# Patient Record
Sex: Female | Born: 1990 | Race: White | Hispanic: No | Smoking: Never smoker
Health system: Southern US, Community
[De-identification: ages and names within clinical notes are randomized; demographics above are authoritative.]

## PROBLEM LIST (undated history)

## (undated) ENCOUNTER — Emergency Department (HOSPITAL_COMMUNITY): Admission: EM | Payer: Self-pay | Source: Home / Self Care

## (undated) DIAGNOSIS — I10 Essential (primary) hypertension: Secondary | ICD-10-CM

## (undated) DIAGNOSIS — J45909 Unspecified asthma, uncomplicated: Secondary | ICD-10-CM

## (undated) DIAGNOSIS — Z973 Presence of spectacles and contact lenses: Secondary | ICD-10-CM

## (undated) DIAGNOSIS — R7303 Prediabetes: Secondary | ICD-10-CM

## (undated) DIAGNOSIS — Z8719 Personal history of other diseases of the digestive system: Secondary | ICD-10-CM

## (undated) DIAGNOSIS — D509 Iron deficiency anemia, unspecified: Secondary | ICD-10-CM

## (undated) DIAGNOSIS — E559 Vitamin D deficiency, unspecified: Secondary | ICD-10-CM

## (undated) DIAGNOSIS — F419 Anxiety disorder, unspecified: Secondary | ICD-10-CM

## (undated) DIAGNOSIS — D649 Anemia, unspecified: Secondary | ICD-10-CM

## (undated) DIAGNOSIS — R9389 Abnormal findings on diagnostic imaging of other specified body structures: Secondary | ICD-10-CM

## (undated) HISTORY — PX: TOOTH EXTRACTION: SUR596

## (undated) HISTORY — DX: Anxiety disorder, unspecified: F41.9

## (undated) HISTORY — DX: Anemia, unspecified: D64.9

---
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
## 2015-05-06 ENCOUNTER — Other Ambulatory Visit (HOSPITAL_COMMUNITY)
Admission: RE | Admit: 2015-05-06 | Discharge: 2015-05-06 | Disposition: A | Discharge status: Home / Self Care | Payer: BLUE CROSS/BLUE SHIELD | Source: Ambulatory Visit | Attending: Obstetrics and Gynecology | Admitting: Obstetrics and Gynecology

## 2015-05-06 DIAGNOSIS — Z01419 Encounter for gynecological examination (general) (routine) without abnormal findings: Secondary | ICD-10-CM | POA: Diagnosis present

## 2015-05-06 DIAGNOSIS — Z113 Encounter for screening for infections with a predominantly sexual mode of transmission: Secondary | ICD-10-CM | POA: Diagnosis present

## 2016-08-20 ENCOUNTER — Encounter: Payer: Self-pay | Admitting: Emergency Medicine

## 2016-08-20 ENCOUNTER — Emergency Department
Admission: EM | Admit: 2016-08-20 | Discharge: 2016-08-20 | Disposition: A | Discharge status: Home / Self Care | Payer: Self-pay | Attending: Emergency Medicine | Admitting: Emergency Medicine

## 2016-08-20 ENCOUNTER — Emergency Department: Payer: Self-pay

## 2016-08-20 DIAGNOSIS — M25512 Pain in left shoulder: Secondary | ICD-10-CM

## 2016-08-20 DIAGNOSIS — J45909 Unspecified asthma, uncomplicated: Secondary | ICD-10-CM | POA: Insufficient documentation

## 2016-08-20 HISTORY — DX: Unspecified asthma, uncomplicated: J45.909

## 2016-08-20 MED ORDER — TRAMADOL HCL 50 MG PO TABS: 50.0000 mg | ORAL_TABLET | Freq: Four times a day (QID) | ORAL | 0 refills | Status: DC | PRN

## 2016-08-20 MED ORDER — NAPROXEN 500 MG PO TABS
500.0000 mg | ORAL_TABLET | Freq: Once | ORAL | Status: AC
  Administered 2016-08-20: 500 mg via ORAL
  Filled 2016-08-20: qty 1

## 2016-08-20 MED ORDER — NAPROXEN 500 MG PO TABS: 500.0000 mg | ORAL_TABLET | Freq: Two times a day (BID) | ORAL | Status: DC

## 2016-08-20 MED ORDER — TRAMADOL HCL 50 MG PO TABS
50.0000 mg | ORAL_TABLET | Freq: Once | ORAL | Status: AC
  Administered 2016-08-20: 50 mg via ORAL
  Filled 2016-08-20: qty 1

## 2016-08-20 NOTE — ED Provider Notes (Signed)
[facility] Emergency Department Provider Note   ____________________________________________   None    (approximate)  I have reviewed the triage vital signs and the nursing notes.   HISTORY  Chief Complaint Shoulder Pain    HPI Whitney Robbins is a 26 y.o. female patient complaining of acute onset of left shoulder pain which started last night. Patient denies any provocative incident for her pain. Patient stated pain increases with abduction and opiate reaching. Patient points at the [facility] joint as a source of pain. Patient rates the pain as a 6/10.Patient described a pain as "achy". No palliative measures for complaint. Patient is right-hand dominant.   Past Medical History:  Diagnosis Date  . Asthma     There are no active problems to display for this patient.   History reviewed. No pertinent surgical history.  Prior to Admission medications   Medication Sig Start Date End Date Taking? Authorizing Provider  naproxen (NAPROSYN) 500 MG tablet Take 1 tablet (500 mg total) by mouth 2 (two) times daily with a meal. 08/20/16   Joni Reining, PA-C  traMADol (ULTRAM) 50 MG tablet Take 1 tablet (50 mg total) by mouth every 6 (six) hours as needed for moderate pain. 08/20/16   Joni Reining, PA-C    Allergies Prednisone  No family history on file.  Social History Social History  Substance Use Topics  . Smoking status: Never Smoker  . Smokeless tobacco: Never Used  . Alcohol use No    Review of Systems  Constitutional: No fever/chills Eyes: No visual changes. ENT: No sore throat. Cardiovascular: Denies chest pain. Respiratory: Denies shortness of breath. Gastrointestinal: No abdominal pain.  No nausea, no vomiting.  No diarrhea.  No constipation. Genitourinary: Negative for dysuria. Musculoskeletal: Shoulder pain  Skin: Negative for rash. Neurological: Negative for headaches, focal weakness or numbness. Allergic/Immunilogical:  Prednisone.  ____________________________________________   PHYSICAL EXAM:  VITAL SIGNS: ED Triage Vitals  Enc Vitals Group     BP 08/20/16 1420 (!) 154/95     Pulse Rate 08/20/16 1420 67     Resp 08/20/16 1420 12     Temp 08/20/16 1420 98.6 F (37 C)     Temp Source 08/20/16 1420 Oral     SpO2 08/20/16 1420 100 %     Weight 08/20/16 1418 240 lb (108.9 kg)     Height --      Head Circumference --      Peak Flow --      Pain Score 08/20/16 1418 7     Pain Loc --      Pain Edu? --      Excl. in GC? --     Constitutional: Alert and oriented. Well appearing and in no acute distress. Neck: No stridor.  No cervical spine tenderness to palpation. Cardiovascular: Normal rate, regular rhythm. Grossly normal heart sounds.  Good peripheral circulation. Respiratory: Normal respiratory effort.  No retractions. Lungs CTAB. Musculoskeletal:No DEFORMITY to the left shoulder. Patient has full and equal range of motion with complain of pain. No lower extremity tenderness nor edema.  No joint effusions. Neurologic:  Normal speech and language. No gross focal neurologic deficits are appreciated. No gait instability. Skin:  Skin is warm, dry and intact. No rash noted. Psychiatric: Mood and affect are normal. Speech and behavior are normal.  ____________________________________________   LABS (all labs ordered are listed, but only abnormal results are displayed)  Labs Reviewed - No data to display ____________________________________________  EKG  ____________________________________________  RADIOLOGY  Dg Shoulder Left  Result Date: 08/20/2016 CLINICAL DATA:  Left shoulder pain, acute.  No preceding injury. EXAM: LEFT SHOULDER - 2+ VIEW COMPARISON:  None. FINDINGS: There is no evidence of fracture or dislocation. There is no evidence of arthropathy or other focal bone abnormality. Soft tissues are unremarkable. IMPRESSION: Normal study. Electronically Signed   By: Marnee SpringJonathon  Watts  M.D.   On: 08/20/2016 15:11    __No acute findings on x-ray of the left shoulder. __________________________________________   PROCEDURES  Procedure(s) performed:   Procedures  Critical Care performed: No  ____________________________________________   INITIAL IMPRESSION / ASSESSMENT AND PLAN / ED COURSE  Pertinent labs & imaging results that were available during my care of the patient were reviewed by me and considered in my medical decision making (see chart for details).  Left shoulder pain. Discussed negatives) with patient. Patient given discharge care instructions. Patient advised to [facility] condition [facility].      ____________________________________________   FINAL CLINICAL IMPRESSION(S) / ED DIAGNOSES  Final diagnoses:  Acute pain of left shoulder      NEW MEDICATIONS STARTED DURING THIS VISIT:  New Prescriptions   NAPROXEN (NAPROSYN) 500 MG TABLET    Take 1 tablet (500 mg total) by mouth 2 (two) times daily with a meal.   TRAMADOL (ULTRAM) 50 MG TABLET    Take 1 tablet (50 mg total) by mouth every 6 (six) hours as needed for moderate pain.     Note:  This document was prepared using Dragon voice recognition software and may include unintentional dictation errors.    Joni ReiningSmith, Ronald K, PA-C 08/20/16 1530    Schaevitz, Myra Rudeavid Matthew, MD 08/20/16 1534

## 2016-08-20 NOTE — ED Triage Notes (Signed)
Pt to ED via POV c/o shoulder pain that started last night. Pt states that pain is worse with movement. Pt does not appear to be in any distress at this time.

## 2016-08-20 NOTE — ED Notes (Signed)
Pt stating left shoulder pain that started yesterday and then decreased sensitivity that started today that comes down into her left hand. Pt is able to move extremity with no difficulty and is denying any injuries or new activities. Pt stating that pain is worse with movement.

## 2016-11-01 ENCOUNTER — Emergency Department
Admission: EM | Admit: 2016-11-01 | Discharge: 2016-11-01 | Disposition: A | Discharge status: Home / Self Care | Payer: BLUE CROSS/BLUE SHIELD | Attending: Emergency Medicine | Admitting: Emergency Medicine

## 2016-11-01 ENCOUNTER — Encounter: Payer: Self-pay | Admitting: Emergency Medicine

## 2016-11-01 DIAGNOSIS — Z791 Long term (current) use of non-steroidal anti-inflammatories (NSAID): Secondary | ICD-10-CM | POA: Insufficient documentation

## 2016-11-01 DIAGNOSIS — K047 Periapical abscess without sinus: Secondary | ICD-10-CM | POA: Insufficient documentation

## 2016-11-01 DIAGNOSIS — J45909 Unspecified asthma, uncomplicated: Secondary | ICD-10-CM | POA: Insufficient documentation

## 2016-11-01 DIAGNOSIS — B958 Unspecified staphylococcus as the cause of diseases classified elsewhere: Secondary | ICD-10-CM | POA: Insufficient documentation

## 2016-11-01 MED ORDER — CLINDAMYCIN HCL 300 MG PO CAPS: 300.0000 mg | ORAL_CAPSULE | Freq: Three times a day (TID) | ORAL | 0 refills | Status: AC

## 2016-11-01 NOTE — ED Triage Notes (Signed)
Pt to ed with c/o rash to body and dental pain and swelling to gums.

## 2016-11-01 NOTE — ED Provider Notes (Signed)
[facility] Emergency Department Provider Note  ____________________________________________  Time seen: Approximately 6:24 PM  I have reviewed the triage vital signs and the nursing notes.   HISTORY  Chief Complaint Rash    HPI Whitney Robbins is a 26 y.o. female presenting to the emergency department with a right lower jaw dental abscess and a diffuse rash that has occurred for approximately 1 week. Patient states that she has an appointment with a local dentist on 11/15/2016. Patient denies drainage from dental abscess. She has been afebrile. Patient states that rash appeared first on her abdomen and has spread to her legs, upper arms and back. Patient denies contact exposures with new linens, makeup, laundry soap or outdoor foliage. She denies chest pain, chest tightness, shortness of, dysphagia, nausea, vomiting or abdominal pain. No alleviating measures have been attempted.   Past Medical History:  Diagnosis Date  . Asthma     There are no active problems to display for this patient.   History reviewed. No pertinent surgical history.  Prior to Admission medications   Medication Sig Start Date End Date Taking? Authorizing Provider  clindamycin (CLEOCIN) 300 MG capsule Take 1 capsule (300 mg total) by mouth 3 (three) times daily. 11/01/16 11/11/16  Orvil Feil, PA-C  naproxen (NAPROSYN) 500 MG tablet Take 1 tablet (500 mg total) by mouth 2 (two) times daily with a meal. 08/20/16   Joni Reining, PA-C  traMADol (ULTRAM) 50 MG tablet Take 1 tablet (50 mg total) by mouth every 6 (six) hours as needed for moderate pain. 08/20/16   Joni Reining, PA-C    Allergies Prednisone  History reviewed. No pertinent family history.  Social History Social History  Substance Use Topics  . Smoking status: Never Smoker  . Smokeless tobacco: Never Used  . Alcohol use No     Review of Systems  Constitutional: No fever/chills Eyes: No visual changes.  No discharge ENT: Patient has dental abscess. Cardiovascular: no chest pain. Respiratory: no cough. No SOB. Musculoskeletal: Negative for musculoskeletal pain. Skin:Patient has rash Neurological: Negative for headaches, focal weakness or numbness.   ____________________________________________   PHYSICAL EXAM:  VITAL SIGNS: ED Triage Vitals  Enc Vitals Group     BP 11/01/16 1748 132/73     Pulse Rate 11/01/16 1747 76     Resp 11/01/16 1747 17     Temp 11/01/16 1747 98.4 F (36.9 C)     Temp Source 11/01/16 1747 Oral     SpO2 11/01/16 1747 100 %     Weight --      Height --      Head Circumference --      Peak Flow --      Pain Score 11/01/16 1748 4     Pain Loc --      Pain Edu? --      Excl. in GC? --      Constitutional: Alert and oriented. Well appearing and in no acute distress. Eyes: Conjunctivae are normal. PERRL. EOMI. Head: Atraumatic. ENT:      Nose: No congestion/rhinnorhea.      Mouth/Throat: Mucous membranes are moist. Patient has dental abscess of right lower jaw. Airway is patent. Neck: Full range of motion. Cardiovascular: Normal rate, regular rhythm. Normal S1 and S2.  Good peripheral circulation. Respiratory: Normal respiratory effort without tachypnea or retractions. Lungs CTAB. Good air entry to the bases with no decreased or absent breath sounds. Musculoskeletal: Full range of motion to all extremities.  No gross deformities appreciated. Neurologic:  Normal speech and language. No gross focal neurologic deficits are appreciated.  Skin: Patient has diffuse, erythematous, macular rash of the face, abdomen, upper extremities and back. Numerous regions have yellow crusting visualized. Psychiatric: Mood and affect are normal. Speech and behavior are normal. Patient exhibits appropriate insight and judgement.   ____________________________________________   LABS (all labs ordered are listed, but only abnormal results are displayed)  Labs Reviewed -  No data to display ____________________________________________  EKG   ____________________________________________  RADIOLOGY   No results found.  ____________________________________________    PROCEDURES  Procedure(s) performed:    Procedures    Medications - No data to display   ____________________________________________   INITIAL IMPRESSION / ASSESSMENT AND PLAN / ED COURSE  Pertinent labs & imaging results that were available during my care of the patient were reviewed by me and considered in my medical decision making (see chart for details).  Review of the [place] CSRS was performed in accordance of the [facility] prior to dispensing any controlled drugs.     Assessment and plan Staph infection Dental abscess Patient presents to the emergency department with diffuse, macular rash with crusting consistent with a staph infection. Patient also has a small dental abscess of the right lower jaw. Patient was discharged with the clindamycin. Patient was advised to keep appointment with dentist on 11/15/2016. Probiotic was also recommended. Vital signs were reassuring at discharge. All patient questions were answered.   ____________________________________________  FINAL CLINICAL IMPRESSION(S) / ED DIAGNOSES  Final diagnoses:  Staph infection  Dental abscess      NEW MEDICATIONS STARTED DURING THIS VISIT:  New Prescriptions   CLINDAMYCIN (CLEOCIN) 300 MG CAPSULE    Take 1 capsule (300 mg total) by mouth 3 (three) times daily.        This chart was dictated using voice recognition software/Dragon. Despite best efforts to proofread, errors can occur which can change the meaning. Any change was purely unintentional.    Orvil Feil, PA-C 11/01/16 Crissie Figures, MD 11/01/16 2219

## 2016-11-01 NOTE — ED Notes (Signed)
Swelling to right lower side of mouth at gumline. Pt noticed that her dental filling was coming out approx 1 week ago. Bilateral arms rash X 1 week.

## 2016-11-18 ENCOUNTER — Encounter: Payer: Self-pay | Admitting: Medical Oncology

## 2016-11-18 ENCOUNTER — Emergency Department: Payer: Self-pay

## 2016-11-18 ENCOUNTER — Emergency Department
Admission: EM | Admit: 2016-11-18 | Discharge: 2016-11-18 | Disposition: A | Discharge status: Home / Self Care | Payer: Self-pay | Attending: Emergency Medicine | Admitting: Emergency Medicine

## 2016-11-18 DIAGNOSIS — J45909 Unspecified asthma, uncomplicated: Secondary | ICD-10-CM | POA: Insufficient documentation

## 2016-11-18 DIAGNOSIS — R1011 Right upper quadrant pain: Secondary | ICD-10-CM

## 2016-11-18 DIAGNOSIS — R1013 Epigastric pain: Secondary | ICD-10-CM | POA: Insufficient documentation

## 2016-11-18 LAB — CBC WITH DIFFERENTIAL/PLATELET
Basophils Absolute: 0.1 10*3/uL (ref 0–0.1)
Basophils Relative: 1 %
Eosinophils Absolute: 0.1 10*3/uL (ref 0–0.7)
Eosinophils Relative: 1 %
HCT: 34.5 % — ABNORMAL LOW (ref 35.0–47.0)
Hemoglobin: 11.2 g/dL — ABNORMAL LOW (ref 12.0–16.0)
LYMPHS ABS: 1.8 10*3/uL (ref 1.0–3.6)
LYMPHS PCT: 25 %
MCH: 22.5 pg — AB (ref 26.0–34.0)
MCHC: 32.3 g/dL (ref 32.0–36.0)
MCV: 69.8 fL — AB (ref 80.0–100.0)
MONO ABS: 0.5 10*3/uL (ref 0.2–0.9)
Monocytes Relative: 7 %
Neutro Abs: 4.8 10*3/uL (ref 1.4–6.5)
Neutrophils Relative %: 66 %
Platelets: 148 10*3/uL — ABNORMAL LOW (ref 150–440)
RBC: 4.95 MIL/uL (ref 3.80–5.20)
RDW: 19.6 % — ABNORMAL HIGH (ref 11.5–14.5)
WBC: 7.2 10*3/uL (ref 3.6–11.0)

## 2016-11-18 LAB — COMPREHENSIVE METABOLIC PANEL
ALK PHOS: 47 U/L (ref 38–126)
ALT: 27 U/L (ref 14–54)
AST: 30 U/L (ref 15–41)
Albumin: 3.8 g/dL (ref 3.5–5.0)
Anion gap: 8 (ref 5–15)
BILIRUBIN TOTAL: 0.2 mg/dL — AB (ref 0.3–1.2)
BUN: 13 mg/dL (ref 6–20)
CHLORIDE: 103 mmol/L (ref 101–111)
CO2: 26 mmol/L (ref 22–32)
Calcium: 8.7 mg/dL — ABNORMAL LOW (ref 8.9–10.3)
Creatinine, Ser: 0.57 mg/dL (ref 0.44–1.00)
GFR calc non Af Amer: >60 mL/min (ref >60)
Glucose, Bld: 90 mg/dL (ref 65–99)
POTASSIUM: 3.5 mmol/L (ref 3.5–5.1)
Sodium: 137 mmol/L (ref 135–145)
Total Protein: 6.9 g/dL (ref 6.5–8.1)

## 2016-11-18 LAB — URINALYSIS, COMPLETE (UACMP) WITH MICROSCOPIC
Bilirubin Urine: NEGATIVE
GLUCOSE, UA: NEGATIVE mg/dL
KETONES UR: NEGATIVE mg/dL
Nitrite: NEGATIVE
PH: 6 (ref 5.0–8.0)
Protein, ur: NEGATIVE mg/dL
Specific Gravity, Urine: 1.008 (ref 1.005–1.030)

## 2016-11-18 LAB — LIPASE, BLOOD: LIPASE: 27 U/L (ref 11–51)

## 2016-11-18 LAB — POCT PREGNANCY, URINE: Preg Test, Ur: NEGATIVE

## 2016-11-18 MED ORDER — OMEPRAZOLE 40 MG PO CPDR: 40.0000 mg | DELAYED_RELEASE_CAPSULE | Freq: Every day | ORAL | 0 refills | Status: DC

## 2016-11-18 MED ORDER — SUCRALFATE 1 G PO TABS: 1.0000 g | ORAL_TABLET | Freq: Four times a day (QID) | ORAL | 0 refills | Status: DC

## 2016-11-18 NOTE — ED Triage Notes (Signed)
Pt reports RUQ abd pain x 2 days with nausea.

## 2016-11-18 NOTE — ED Provider Notes (Signed)
[facility] Emergency Department Provider Note  ____________________________________________   First MD Initiated Contact with Patient 11/18/16 [phone]     (approximate)  I have reviewed the triage vital signs and the nursing notes.   HISTORY  Chief Complaint Abdominal Pain   HPI Whitney Robbins is a 26 y.o. female with a history of asthma who is presenting to the emergency department today with 3 days of right upper quadrant abdominal pain. Says that the pain is sharp and nonradiating. She's been nauseous but without any vomiting. Denies any diarrhea but does admit that she has had more frequent stools. Denies any burning with urination does not report any bleeding in the vagina nor discharge. Says that she has had a decreased appetite does not note that the pain starts after eating.   Past Medical History:  Diagnosis Date  . Asthma     There are no active problems to display for this patient.   History reviewed. No pertinent surgical history.  Prior to Admission medications   Medication Sig Start Date End Date Taking? Authorizing Provider  naproxen (NAPROSYN) 500 MG tablet Take 1 tablet (500 mg total) by mouth 2 (two) times daily with a meal. Patient not taking: Reported on 11/18/2016 08/20/16   Joni Reining, PA-C  traMADol (ULTRAM) 50 MG tablet Take 1 tablet (50 mg total) by mouth every 6 (six) hours as needed for moderate pain. Patient not taking: Reported on 11/18/2016 08/20/16   Joni Reining, PA-C    Allergies Prednisone  No family history on file.  Social History Social History  Substance Use Topics  . Smoking status: Never Smoker  . Smokeless tobacco: Never Used  . Alcohol use No    Review of Systems  Constitutional: No fever/chills Eyes: No visual changes. ENT: No sore throat. Cardiovascular: Denies chest pain. Respiratory: Denies shortness of breath. Gastrointestinal: no vomiting.  No diarrhea.  No  constipation. Genitourinary: Negative for dysuria. Musculoskeletal: Negative for back pain. Skin: Negative for rash. Neurological: Negative for headaches, focal weakness or numbness.   ____________________________________________   PHYSICAL EXAM:  VITAL SIGNS: ED Triage Vitals  Enc Vitals Group     BP 11/18/16 0930 (!) 157/100     Pulse Rate 11/18/16 0930 68     Resp 11/18/16 0930 18     Temp 11/18/16 0930 98.1 F (36.7 C)     Temp Source 11/18/16 0930 Oral     SpO2 11/18/16 0930 100 %     Weight 11/18/16 0922 240 lb (108.9 kg)     Height --      Head Circumference --      Peak Flow --      Pain Score 11/18/16 0922 3     Pain Loc --      Pain Edu? --      Excl. in GC? --     Constitutional: Alert and oriented. Well appearing and in no acute distress. Eyes: Conjunctivae are normal.  Head: Atraumatic. Nose: No congestion/rhinnorhea. Mouth/Throat: Mucous membranes are moist.  Neck: No stridor.   Cardiovascular: Normal rate, regular rhythm. Grossly normal heart sounds.   Respiratory: Normal respiratory effort.  No retractions. Lungs CTAB. Gastrointestinal: Soft with mild right upper quadrant tenderness to palpation with a negative Murphy sign. No distention. No CVA tenderness. Musculoskeletal: No lower extremity tenderness nor edema.  No joint effusions. Neurologic:  Normal speech and language. No gross focal neurologic deficits are appreciated. Skin:  Skin is warm, dry and intact.  No rash noted. Psychiatric: Mood and affect are normal. Speech and behavior are normal.  ____________________________________________   LABS (all labs ordered are listed, but only abnormal results are displayed)  Labs Reviewed  CBC WITH DIFFERENTIAL/PLATELET - Abnormal; Notable for the following:       Result Value   Hemoglobin 11.2 (*)    HCT 34.5 (*)    MCV 69.8 (*)    MCH 22.5 (*)    RDW 19.6 (*)    Platelets 148 (*)    All other components within normal limits  COMPREHENSIVE  METABOLIC PANEL - Abnormal; Notable for the following:    Calcium 8.7 (*)    Total Bilirubin 0.2 (*)    All other components within normal limits  URINALYSIS, COMPLETE (UACMP) WITH MICROSCOPIC - Abnormal; Notable for the following:    Color, Urine YELLOW (*)    APPearance HAZY (*)    Hgb urine dipstick MODERATE (*)    Leukocytes, UA LARGE (*)    Bacteria, UA RARE (*)    Squamous Epithelial / LPF 6-30 (*)    All other components within normal limits  URINE CULTURE  LIPASE, BLOOD  POC URINE PREG, ED  POCT PREGNANCY, URINE   ____________________________________________  EKG    ____________________________________________  RADIOLOGY  Unremarkable right upper quadrant all sounds ____________________________________________   PROCEDURES  Procedure(s) performed:   Procedures  Critical Care performed:   ____________________________________________   INITIAL IMPRESSION / ASSESSMENT AND PLAN / ED COURSE  Pertinent labs & imaging results that were available during my care of the patient were reviewed by me and considered in my medical decision making (see chart for details).  PERC negative.     ----------------------------------------- 1:13 PM on 11/18/2016 -----------------------------------------  Patient with large leukocyte esterase but also with squamous epithelial cells in the urine. Patient is denying any urinary symptoms. She says that she does have a history of GERD but is off of her Prilosec OTC.Possible gastritis. She says that she does have intermittent reflux but is still denying any pain at this time. Still with very mild epigastric tenderness to palpation at this time.I will restart her on a PPI as well as sucralfate. I will refer her to primary care. We discussed return precautions especially if her pain moves to the lower abdomen. At this time there is no pain across the lower abdomen nor is there any tenderness to palpation. She has normal white blood cell  count is relatively for the patient to have appendicitis.  ____________________________________________   FINAL CLINICAL IMPRESSION(S) / ED DIAGNOSES  Final diagnoses:  RUQ pain  epigastric pain.    NEW MEDICATIONS STARTED DURING THIS VISIT:  New Prescriptions   No medications on file     Note:  This document was prepared using Dragon voice recognition software and may include unintentional dictation errors.     Myrna Blazer, MD 11/18/16 1314

## 2016-11-19 LAB — URINE CULTURE

## 2016-11-20 NOTE — Progress Notes (Signed)
ED Culture Report  Urine cultures: 100 k CFU Group B strep   Patient did not have any symptoms of urinary tract infection and is not pregnant based on pregnancy test. Spoke with MD Malinda and recommended not treated the patient's urine culture. MD Darnelle Catalan was in agreeance and will not treat.  Demetrius Charity, PharmD

## 2017-02-05 ENCOUNTER — Other Ambulatory Visit: Payer: Self-pay

## 2017-02-05 ENCOUNTER — Emergency Department
Admission: EM | Admit: 2017-02-05 | Discharge: 2017-02-05 | Disposition: A | Discharge status: Home / Self Care | Payer: Self-pay | Attending: Emergency Medicine | Admitting: Emergency Medicine

## 2017-02-05 ENCOUNTER — Encounter: Payer: Self-pay | Admitting: Emergency Medicine

## 2017-02-05 DIAGNOSIS — B370 Candidal stomatitis: Secondary | ICD-10-CM | POA: Insufficient documentation

## 2017-02-05 DIAGNOSIS — N3090 Cystitis, unspecified without hematuria: Secondary | ICD-10-CM | POA: Insufficient documentation

## 2017-02-05 DIAGNOSIS — Z79899 Other long term (current) drug therapy: Secondary | ICD-10-CM | POA: Insufficient documentation

## 2017-02-05 DIAGNOSIS — J45909 Unspecified asthma, uncomplicated: Secondary | ICD-10-CM | POA: Insufficient documentation

## 2017-02-05 LAB — URINALYSIS, COMPLETE (UACMP) WITH MICROSCOPIC
BACTERIA UA: NONE SEEN
BILIRUBIN URINE: NEGATIVE
Glucose, UA: NEGATIVE mg/dL
Hgb urine dipstick: NEGATIVE
KETONES UR: NEGATIVE mg/dL
Nitrite: NEGATIVE
Protein, ur: 100 mg/dL — AB
Specific Gravity, Urine: 1.021 (ref 1.005–1.030)
pH: 6 (ref 5.0–8.0)

## 2017-02-05 MED ORDER — MAGIC MOUTHWASH: 5.0000 mL | Freq: Four times a day (QID) | ORAL | 0 refills | Status: DC

## 2017-02-05 MED ORDER — CEPHALEXIN 500 MG PO CAPS: 500.0000 mg | ORAL_CAPSULE | Freq: Two times a day (BID) | ORAL | 0 refills | Status: AC

## 2017-02-05 MED ORDER — ACETAMINOPHEN 325 MG PO TABS
650.0000 mg | ORAL_TABLET | Freq: Once | ORAL | Status: AC | PRN
  Administered 2017-02-05: 650 mg via ORAL
  Filled 2017-02-05: qty 2

## 2017-02-05 MED ORDER — PHENAZOPYRIDINE HCL 100 MG PO TABS: 100.0000 mg | ORAL_TABLET | Freq: Three times a day (TID) | ORAL | 0 refills | Status: AC | PRN

## 2017-02-05 NOTE — ED Notes (Signed)
Pt c/o fever took nyquil last night, no other meds today.  Pt already provided UA in triage.

## 2017-02-05 NOTE — ED Triage Notes (Signed)
States fever since weds, urinary problems as well and states throat feels like its going to close up - talking in full sentences, voice not muffled, throat not red. Tongue coated. States her boyfriend has a yeast infection last week

## 2017-02-05 NOTE — ED Provider Notes (Signed)
[facility] Emergency Department Provider Note  ____________________________________________  Time seen: Approximately 12:21 PM  I have reviewed the triage vital signs and the nursing notes.   HISTORY  Chief Complaint Fever    HPI Whitney Robbins is a [age] y.o. female that presents to the emergency department for evaluation of chills and dysuria for 4 days.  When she urinates, it burns and she is only able to go a Ivery bit.  She had some lower central abdominal pain yesterday.  Patient states that her mouth has also felt dry for several days.  She states that no matter how much she drinks, her mouth still feels dry.  Her toothpaste tasted like licorice this morning.  She is drinking a lot of Sprite, Ginger ale, and juice. She is not drinking much water. She is having irregular periods.  Her menstrual cycle started yesterday.  No vomiting, back pain, flank pain, vaginal discharge, urgency frequency.   Past Medical History:  Diagnosis Date  . Asthma     There are no active problems to display for this patient.   History reviewed. No pertinent surgical history.  Prior to Admission medications   Medication Sig Start Date End Date Taking? Authorizing Provider  cephALEXin (KEFLEX) 500 MG capsule Take 1 capsule (500 mg total) by mouth 2 (two) times daily for 10 days. 02/05/17 02/15/17  Enid DerryWagner, Jahari Billy, PA-C  magic mouthwash SOLN Take 5 mLs by mouth 4 (four) times daily. 02/05/17   Enid DerryWagner, Lexxus Underhill, PA-C  naproxen (NAPROSYN) 500 MG tablet Take 1 tablet (500 mg total) by mouth 2 (two) times daily with a meal. Patient not taking: Reported on 11/18/2016 08/20/16   Joni ReiningSmith, Ronald K, PA-C  omeprazole (PRILOSEC) 40 MG capsule Take 1 capsule (40 mg total) by mouth daily. 11/18/16 11/18/17  Myrna BlazerSchaevitz, David Matthew, MD  phenazopyridine (PYRIDIUM) 100 MG tablet Take 1 tablet (100 mg total) by mouth 3 (three) times daily as needed for up to 2 days for pain. 02/05/17 02/07/17   Enid DerryWagner, Duanne Duchesne, PA-C  sucralfate (CARAFATE) 1 g tablet Take 1 tablet (1 g total) by mouth 4 (four) times daily. 11/18/16 11/18/17  Schaevitz, Myra Rudeavid Matthew, MD  traMADol (ULTRAM) 50 MG tablet Take 1 tablet (50 mg total) by mouth every 6 (six) hours as needed for moderate pain. Patient not taking: Reported on 11/18/2016 08/20/16   Joni ReiningSmith, Ronald K, PA-C    Allergies Prednisone  History reviewed. No pertinent family history.  Social History Social History   Tobacco Use  . Smoking status: Never Smoker  . Smokeless tobacco: Never Used  Substance Use Topics  . Alcohol use: No  . Drug use: No     Review of Systems  Cardiovascular: No chest pain. Respiratory:  No SOB. Gastrointestinal: No nausea, no vomiting.  Musculoskeletal: Negative for musculoskeletal pain. Skin: Negative for rash, abrasions, lacerations, ecchymosis.   ____________________________________________   PHYSICAL EXAM:  VITAL SIGNS: ED Triage Vitals  Enc Vitals Group     BP 02/05/17 1110 (!) 141/85     Pulse Rate 02/05/17 1110 100     Resp 02/05/17 1110 16     Temp 02/05/17 1110 (!) 100.7 F (38.2 C)     Temp Source 02/05/17 1110 Oral     SpO2 02/05/17 1110 99 %     Weight 02/05/17 1110 240 lb (108.9 kg)     Height 02/05/17 1110 5\' 5"  (1.651 m)     Head Circumference --      Peak Flow --  Pain Score 02/05/17 1109 5     Pain Loc --      Pain Edu? --      Excl. in GC? --      Constitutional: Alert and oriented. Well appearing and in no acute distress. Eyes: Conjunctivae are normal. PERRL. EOMI. Head: Atraumatic. ENT:      Ears:      Nose: No congestion/rhinnorhea.      Mouth/Throat: Mucous membranes are moist.  White coating to tongue. Neck: No stridor.  Cardiovascular: Normal rate, regular rhythm.  Good peripheral circulation. Respiratory: Normal respiratory effort without tachypnea or retractions. Lungs CTAB. Good air entry to the bases with no decreased or absent breath  sounds. Gastrointestinal: Bowel sounds 4 quadrants. Soft and nontender to palpation. No guarding or rigidity. No palpable masses. No distention. No CVA tenderness. Musculoskeletal: Full range of motion to all extremities. No gross deformities appreciated. Neurologic:  Normal speech and language. No gross focal neurologic deficits are appreciated.  Skin:  Skin is warm, dry and intact. No rash noted.  ____________________________________________   LABS (all labs ordered are listed, but only abnormal results are displayed)  Labs Reviewed  URINALYSIS, COMPLETE (UACMP) WITH MICROSCOPIC - Abnormal; Notable for the following components:      Result Value   Color, Urine YELLOW (*)    APPearance CLOUDY (*)    Protein, ur 100 (*)    Leukocytes, UA MODERATE (*)    Squamous Epithelial / LPF 6-30 (*)    All other components within normal limits  URINE CULTURE   ____________________________________________  EKG   ____________________________________________  RADIOLOGY  No results found.  ____________________________________________    PROCEDURES  Procedure(s) performed:    Procedures    Medications  acetaminophen (TYLENOL) tablet 650 mg (650 mg Oral Given 02/05/17 1135)     ____________________________________________   INITIAL IMPRESSION / ASSESSMENT AND PLAN / ED COURSE  Pertinent labs & imaging results that were available during my care of the patient were reviewed by me and considered in my medical decision making (see chart for details).  Review of the [place] CSRS was performed in accordance of the [facility] prior to dispensing any controlled drugs.   Patient's diagnosis is consistent with thrush and cystitis.  Vital signs and exam are reassuring.  Urinalysis and symptoms consistent with infection.  She had some suprapubic discomfort yesterday and is not having any today.  Symptoms are consistent with thrush.  Education was provided.  Patient will be discharged home with  prescriptions for Keflex, Pyridium, Magic mouthwash. Patient is to follow up with PCP as directed. Patient is given ED precautions to return to the ED for any worsening or new symptoms.     ____________________________________________  FINAL CLINICAL IMPRESSION(S) / ED DIAGNOSES  Final diagnoses:  Thrush  Cystitis      NEW MEDICATIONS STARTED DURING THIS VISIT:  ED Discharge Orders        Ordered    cephALEXin (KEFLEX) 500 MG capsule  2 times daily     02/05/17 1228    magic mouthwash SOLN  4 times daily     02/05/17 1228    phenazopyridine (PYRIDIUM) 100 MG tablet  3 times daily PRN     02/05/17 1228          This chart was dictated using voice recognition software/Dragon. Despite best efforts to proofread, errors can occur which can change the meaning. Any change was purely unintentional.    Enid Derry, PA-C 02/05/17 1723  Jeanmarie PlantMcShane, James A, MD 02/09/17 [phone]

## 2017-02-07 LAB — URINE CULTURE

## 2018-06-30 IMAGING — US US ABDOMEN LIMITED
1 series · 14 of 25 positions shown · non-contrast
Comparison: None.

CLINICAL DATA: Right upper quadrant pain, nausea for 2 days

EXAM:
ULTRASOUND ABDOMEN LIMITED RIGHT UPPER QUADRANT

[Series 1: us abdomen limited · 0.22mm/px · 14 of 43 slices shown]
[im 1/43]
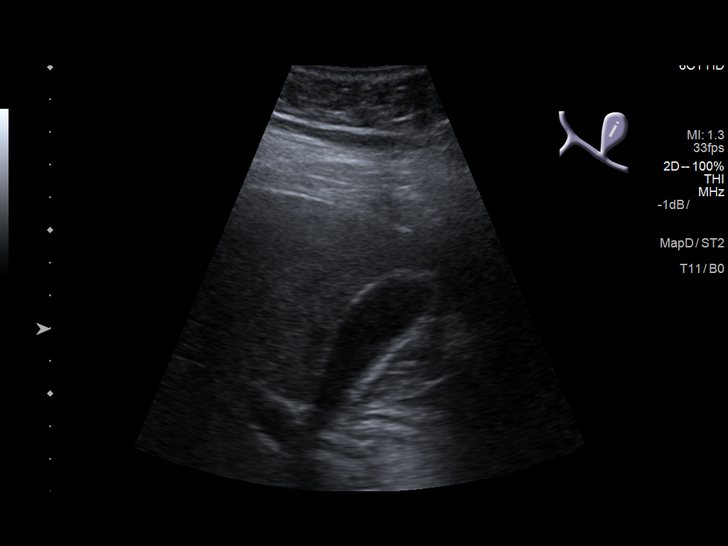
[im 4/43]
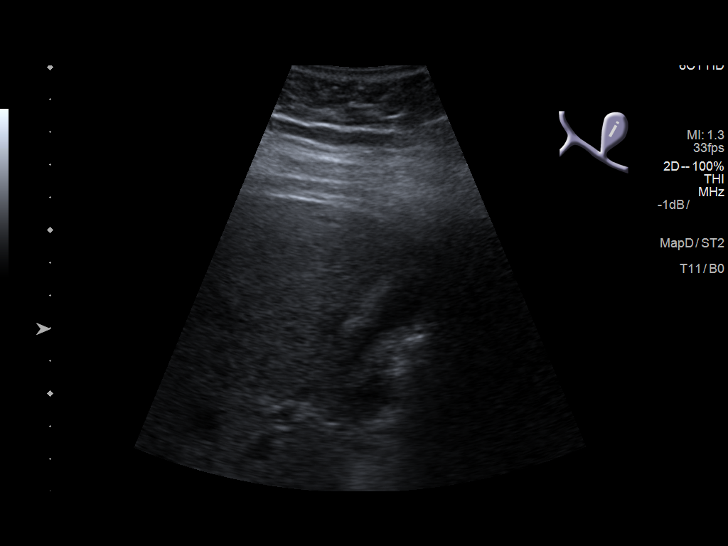
[im 8/43]
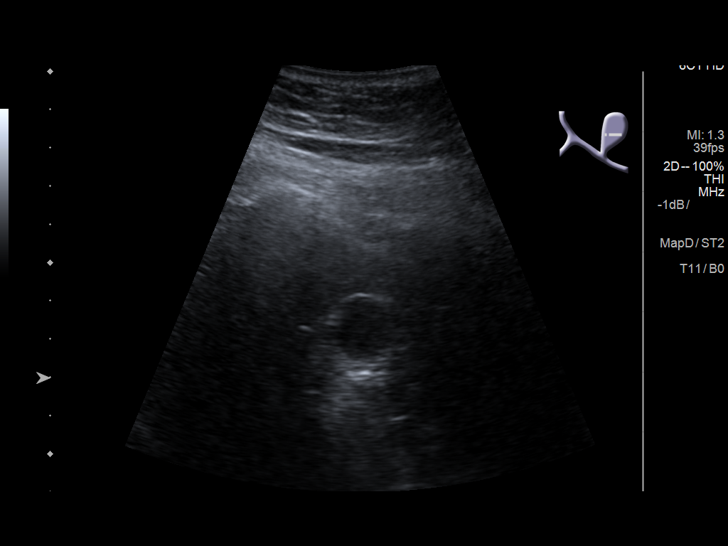
[im 11/43]
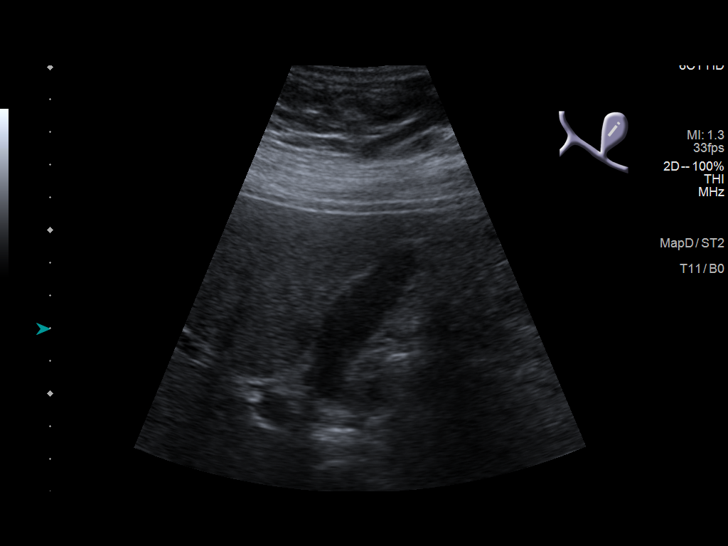
[im 15/43]
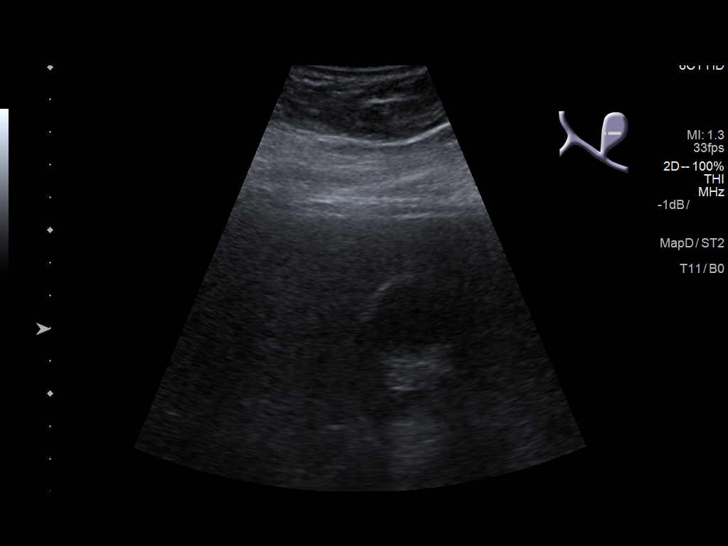
[im 16/43]
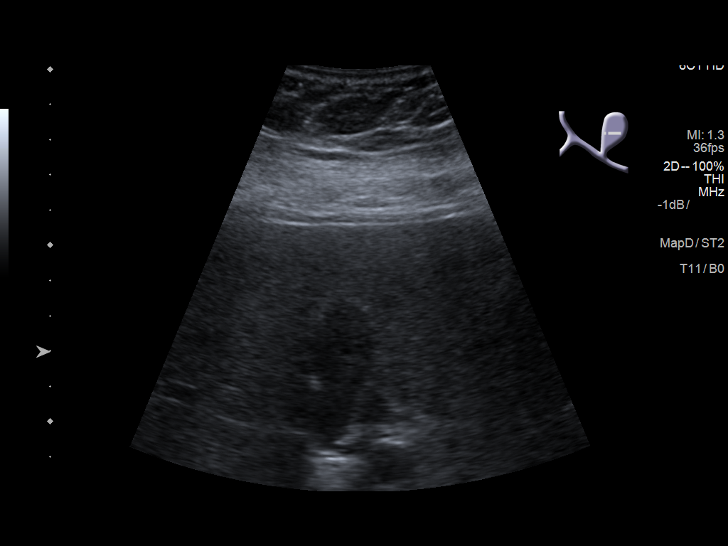
[im 20/43]
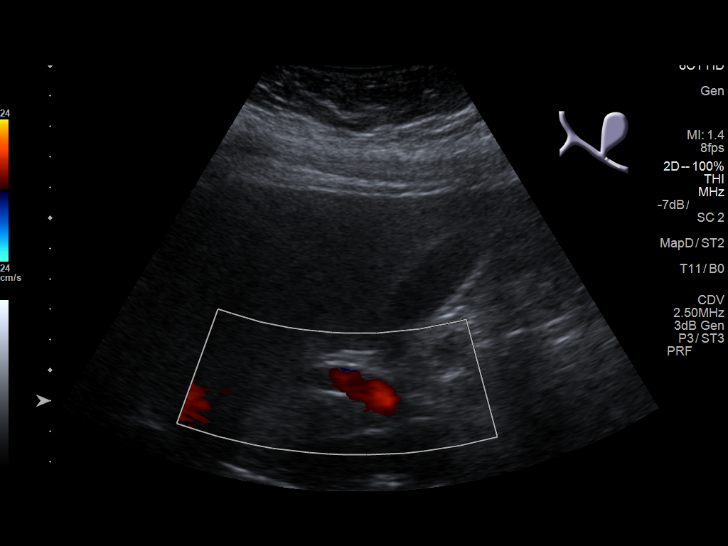
[im 23/43]
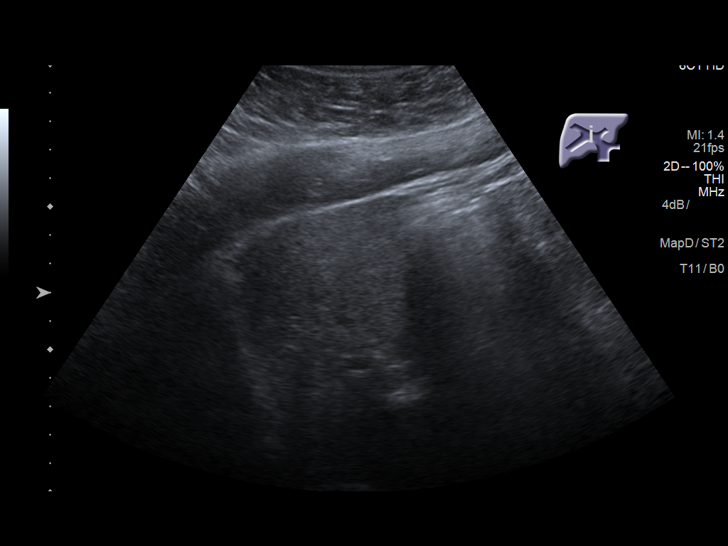
[im 27/43]
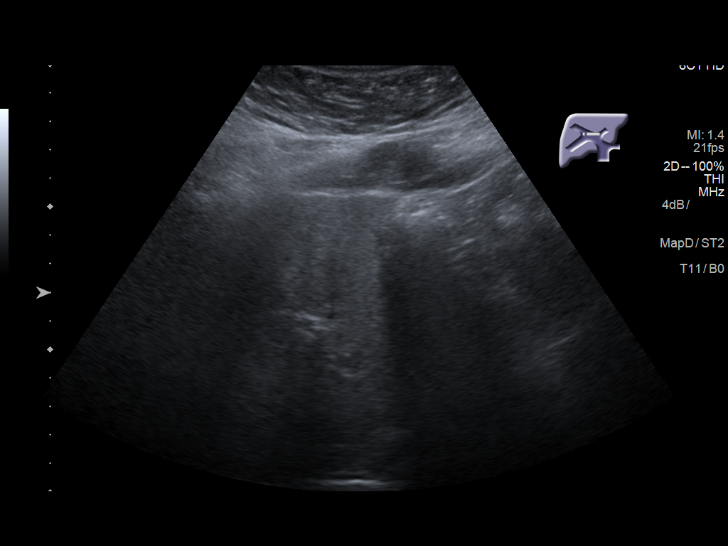
[im 29/43]
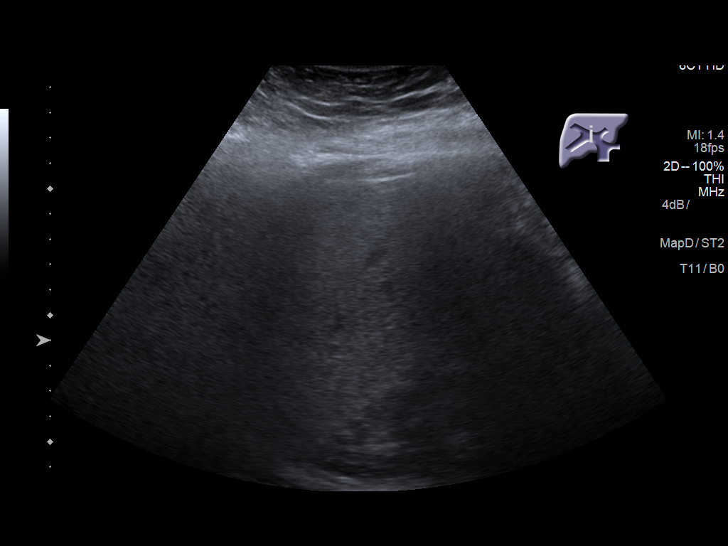
[im 32/43]
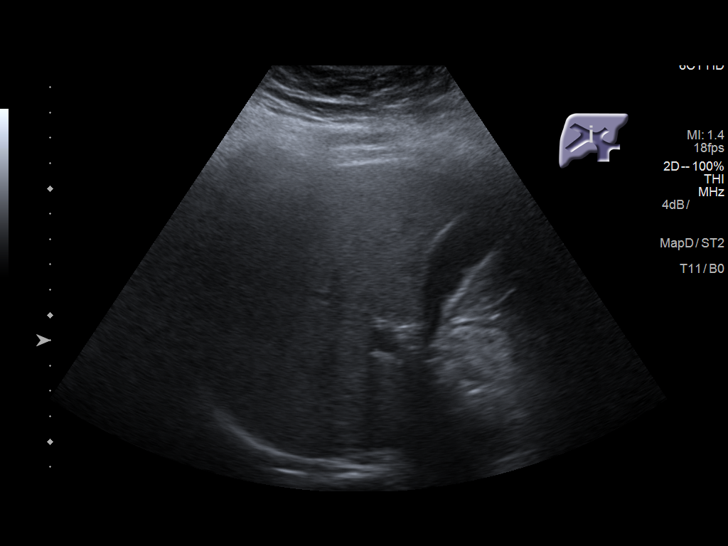
[im 36/43]
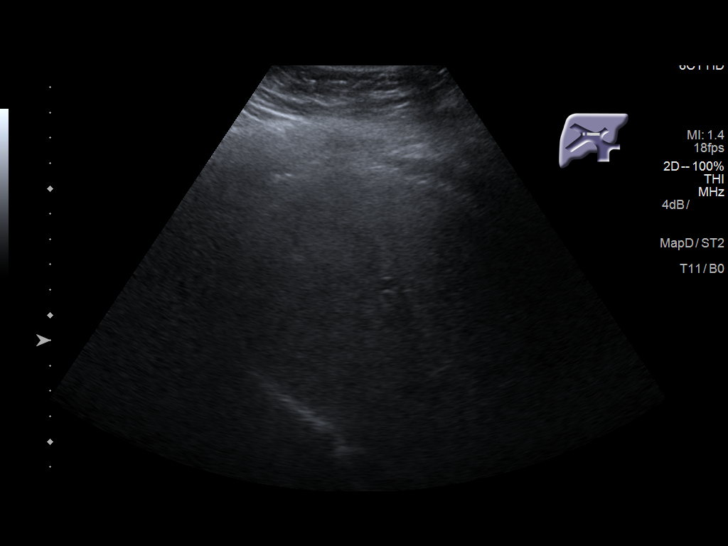
[im 39/43]
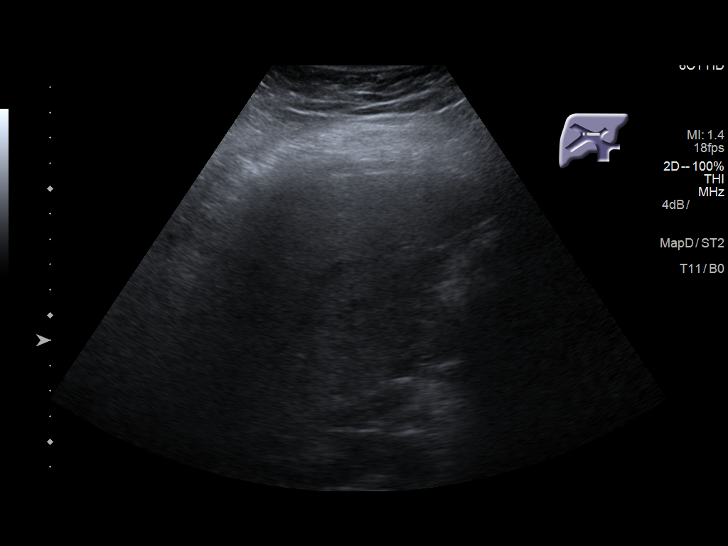
[im 43/43]
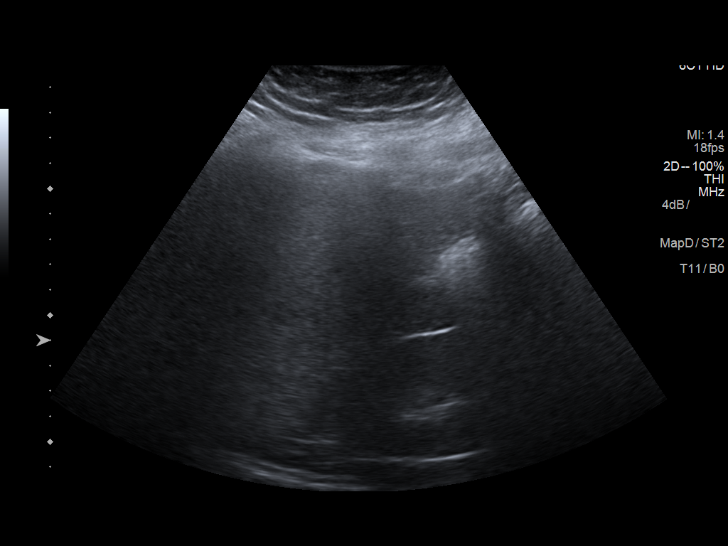

[14 of 25 positions shown; findings below may reference images not displayed]

FINDINGS: Gallbladder:

No gallstones or wall thickening visualized. No sonographic Murphy
sign noted by sonographer.

Common bile duct:

Diameter: Normal caliber, 3 mm

Liver:

No focal lesion identified. Within normal limits in parenchymal
echogenicity. Portal vein is patent on color Doppler imaging with
normal direction of blood flow towards the liver.
IMPRESSION: Unremarkable right upper quadrant ultrasound.

## 2019-10-05 ENCOUNTER — Emergency Department (HOSPITAL_COMMUNITY)
Admission: EM | Admit: 2019-10-05 | Discharge: 2019-10-05 | Disposition: A | Discharge status: Home / Self Care | Payer: Self-pay | Attending: Emergency Medicine | Admitting: Emergency Medicine

## 2019-10-05 ENCOUNTER — Other Ambulatory Visit: Payer: Self-pay

## 2019-10-05 DIAGNOSIS — M542 Cervicalgia: Secondary | ICD-10-CM | POA: Insufficient documentation

## 2019-10-05 DIAGNOSIS — J069 Acute upper respiratory infection, unspecified: Secondary | ICD-10-CM | POA: Insufficient documentation

## 2019-10-05 DIAGNOSIS — J45909 Unspecified asthma, uncomplicated: Secondary | ICD-10-CM | POA: Insufficient documentation

## 2019-10-05 NOTE — ED Triage Notes (Signed)
Pt. Stated, Im pretty sure I have the croup, I started coughing on Thursday and have sinus problem and this morning woke up with no voice.. I work at a daycare.

## 2019-10-05 NOTE — ED Provider Notes (Signed)
[facility] EMERGENCY DEPARTMENT Provider Note   CSN: [id number] Arrival date & time: 10/05/19  6606     History Chief Complaint  Patient presents with  . Cough  . Hoarse    Whitney Robbins is a 29 y.o. female with history of asthma presents to the ED for evaluation of "croupy cough".  States she woke up this morning around 6 AM with sudden onset low pitched forceful, dry cough.  Patient has been in the waiting room for 10 hours and states her cough has actually improved.  Reports also initially having a hoarse voice but this is also come back.  Yesterday she had some sinus congestion and thought it was related to her allergies, history of allergies.  Today she woke up and felt worsening nasal congestion, cough and mild headache.  She has a mild sore throat.  Patient has received Covid vaccine in March.  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  States she was just around a child that had croup.  She denies fevers.  She denies any neck swelling.  She denies any phlegm production, chest pain or shortness of breath.  No vomiting or diarrhea.  Has lost sense of smell due to nasal congestion.  Reports having wheezing last night but this also improved.  Took robitussin last night.   HPI     Past Medical History:  Diagnosis Date  . Asthma     There are no problems to display for this patient.   No past surgical history on file.   OB History   No obstetric history on file.     No family history on file.  Social History   Tobacco Use  . Smoking status: Never Smoker  . Smokeless tobacco: Never Used  Substance Use Topics  . Alcohol use: No  . Drug use: No    Home Medications Prior to Admission medications   Medication Sig Start Date End Date Taking? Authorizing Provider  magic mouthwash SOLN Take 5 mLs by mouth 4 (four) times daily. 02/05/17   Enid Derry, PA-C  naproxen (NAPROSYN) 500 MG tablet Take 1 tablet (500 mg total) by  mouth 2 (two) times daily with a meal. Patient not taking: Reported on 11/18/2016 08/20/16   Joni Reining, PA-C  omeprazole (PRILOSEC) 40 MG capsule Take 1 capsule (40 mg total) by mouth daily. 11/18/16 11/18/17  Schaevitz, Myra Rude, MD  sucralfate (CARAFATE) 1 g tablet Take 1 tablet (1 g total) by mouth 4 (four) times daily. 11/18/16 11/18/17  Schaevitz, Myra Rude, MD  traMADol (ULTRAM) 50 MG tablet Take 1 tablet (50 mg total) by mouth every 6 (six) hours as needed for moderate pain. Patient not taking: Reported on 11/18/2016 08/20/16   Joni Reining, PA-C    Allergies    Prednisone  Review of Systems   Review of Systems  HENT: Positive for congestion, postnasal drip, rhinorrhea and sore throat.   Respiratory: Positive for cough and wheezing (improved).   Neurological: Positive for headaches.  All other systems reviewed and are negative.   Physical Exam Updated Vital Signs BP (!) 146/86   Pulse 74   Temp 98.6 F (37 C)   Resp 18   LMP 08/12/2019   SpO2 99%   Physical Exam Vitals and nursing note reviewed.  Constitutional:      General: She is not in acute distress.    Appearance: She is well-developed.  Comments: NAD.  HENT:     Head: Normocephalic and atraumatic.     Right Ear: External ear normal.     Left Ear: External ear normal.     Nose: Congestion and rhinorrhea present.     Mouth/Throat:     Comments: Minimal soft palate/tonsillar pillar erythema. No edema, exudates, petechiae. Uvula midline. No trismus. No stridor. Speaking in full sentences. Minimal hoarse voice noted. Tolerating secretions. Normal SL space.  Eyes:     General: No scleral icterus.    Conjunctiva/sclera: Conjunctivae normal.  Neck:     Comments: Mild tracheal tenderness, no edema, asymmetry, crepitus, warmth. Trachea midline. Mild cervical LAD.  Cardiovascular:     Rate and Rhythm: Normal rate and regular rhythm.     Heart sounds: Normal heart sounds. No murmur heard.   Pulmonary:      Effort: Pulmonary effort is normal.     Breath sounds: Normal breath sounds. No wheezing.     Comments: Normal WOB. No wheezing, rales, crackles.  Musculoskeletal:        General: No deformity. Normal range of motion.     Cervical back: Normal range of motion and neck supple. Tenderness present.  Skin:    General: Skin is warm and dry.     Capillary Refill: Capillary refill takes less than 2 seconds.  Neurological:     Mental Status: She is alert and oriented to person, place, and time.  Psychiatric:        Behavior: Behavior normal.        Thought Content: Thought content normal.        Judgment: Judgment normal.     ED Results / Procedures / Treatments   Labs (all labs ordered are listed, but only abnormal results are displayed) Labs Reviewed - No data to display  EKG None  Radiology No results found.  Procedures Procedures (including critical care time)  Medications Ordered in ED Medications - No data to display  ED Course  I have reviewed the triage vital signs and the nursing notes.  Pertinent labs & imaging results that were available during my care of the patient were reviewed by me and considered in my medical decision making (see chart for details).    MDM Rules/Calculators/A&P                          29 y.o. -year-old female with history of asthma presents with URI like symptoms for less than 24 hours. Known sick contacts. Works at daycare around infants reportedly recently around baby with croup.    On my exam patient is nontoxic appearing, speaking in full sentences, w/o increased WOB. No fever, tachypnea, tachycardia, hypoxia. Lungs are CTAB. I do not think that a CXR is indicated at this time as VS are WNL, there are no signs of consolidation on auscultation and there is no hypoxia. No significant h/o immunocompromise. Doubt bacterial bronchitis or pneumonia.  Throat/neck exam with minimal erythema, otherwise benign.  No signs of deep neck or throat  infection.  No stridor. Tolerating secretions.  Given patient's profession I offered strep test, COVID test but she declined.  Reasonable. She is fully vaccinated for COVID and symptoms appear mild.  No fever.    Given reassuring physical exam, will discharge with symptomatic treatment. Strict ED return precautions given. Patient is aware that a viral URI infection may precede pneumonia or worsening illness. Patient is aware of red flag symptoms to monitor for  that would warrant return to the ED for further reevaluation.    Final Clinical Impression(s) / ED Diagnoses Final diagnoses:  Viral URI with cough    Rx / DC Orders ED Discharge Orders    None       Liberty Handy, PA-C 10/05/19 1743    Mancel Bale, MD 10/05/19 2041

## 2019-10-05 NOTE — Discharge Instructions (Signed)
Your symptoms are most likely from a virus that has caused an upper respiratory infection.  Viral illness typically peaks on day 2-3 and resolves after one week.     The main treatment approach for a viral upper respiratory infection is to treat the symptoms, support your immune system and prevent spread of illness.    Stay well-hydrated. Rest. You can use over the counter medications to help with symptoms: 600 mg ibuprofen (motrin, aleve, advil) or acetaminophen (tylenol) every 6 hours, around the clock to help with associated fevers, sore throat, headaches, generalized body aches and malaise.  Oxymetazoline (afrin) intranasal spray once daily for no more than 3 days to help with congestion, after 3 days you can switch to another over-the-counter nasal steroid spray such as fluticasone (flonase) Allergy medication (loratadine, cetirizine, etc) and phenylephrine (sudafed) help with nasal congestion, runny nose and postnasal drip.   Dextromethorphan (Delsym) to suppress dry cough  Guaifenesin (mucinex) to help with built up mucus in chest and productive cough Wash your hands often to prevent spread.    A viral upper respiratory infection can also worsen and progress into pneumonia.  Monitor your symptoms. Return for persistent fevers, worsening throat pain or swelling, difficulty swallowing or breathing, chest pain, productive cough, inability to tolerate fluids despite nausea medicines or dehydration

## 2019-10-05 NOTE — ED Notes (Signed)
Croupy cough for 2-3 days no temp

## 2021-06-11 ENCOUNTER — Other Ambulatory Visit: Payer: Self-pay

## 2021-06-11 ENCOUNTER — Encounter (HOSPITAL_COMMUNITY): Payer: Self-pay

## 2021-06-11 ENCOUNTER — Encounter (HOSPITAL_COMMUNITY): Payer: Self-pay | Admitting: Emergency Medicine

## 2021-06-11 ENCOUNTER — Emergency Department (HOSPITAL_COMMUNITY)
Admission: EM | Admit: 2021-06-11 | Discharge: 2021-06-11 | Disposition: A | Discharge status: Home / Self Care | Payer: 59 | Attending: Emergency Medicine | Admitting: Emergency Medicine

## 2021-06-11 ENCOUNTER — Ambulatory Visit (HOSPITAL_COMMUNITY)
Admission: EM | Admit: 2021-06-11 | Discharge: 2021-06-11 | Disposition: A | Discharge status: Home / Self Care | Payer: 59

## 2021-06-11 DIAGNOSIS — R5383 Other fatigue: Secondary | ICD-10-CM | POA: Insufficient documentation

## 2021-06-11 DIAGNOSIS — R110 Nausea: Secondary | ICD-10-CM | POA: Diagnosis not present

## 2021-06-11 DIAGNOSIS — R42 Dizziness and giddiness: Secondary | ICD-10-CM | POA: Insufficient documentation

## 2021-06-11 DIAGNOSIS — N939 Abnormal uterine and vaginal bleeding, unspecified: Secondary | ICD-10-CM | POA: Insufficient documentation

## 2021-06-11 DIAGNOSIS — N3000 Acute cystitis without hematuria: Secondary | ICD-10-CM

## 2021-06-11 DIAGNOSIS — R030 Elevated blood-pressure reading, without diagnosis of hypertension: Secondary | ICD-10-CM

## 2021-06-11 LAB — HEMOGLOBIN AND HEMATOCRIT, BLOOD
HCT: 32.2 % — ABNORMAL LOW (ref 36.0–46.0)
Hemoglobin: 9.4 g/dL — ABNORMAL LOW (ref 12.0–15.0)

## 2021-06-11 LAB — COMPREHENSIVE METABOLIC PANEL
ALT: 43 U/L (ref 0–44)
AST: 44 U/L — ABNORMAL HIGH (ref 15–41)
Albumin: 3.8 g/dL (ref 3.5–5.0)
Alkaline Phosphatase: 56 U/L (ref 38–126)
Anion gap: 8 (ref 5–15)
BUN: 7 mg/dL (ref 6–20)
CO2: 27 mmol/L (ref 22–32)
Calcium: 9 mg/dL (ref 8.9–10.3)
Chloride: 104 mmol/L (ref 98–111)
Creatinine, Ser: 0.66 mg/dL (ref 0.44–1.00)
GFR, Estimated: >60 mL/min (ref >60)
Glucose, Bld: 129 mg/dL — ABNORMAL HIGH (ref 70–99)
Potassium: 3.4 mmol/L — ABNORMAL LOW (ref 3.5–5.1)
Sodium: 139 mmol/L (ref 135–145)
Total Bilirubin: 0.3 mg/dL (ref 0.3–1.2)
Total Protein: 7.2 g/dL (ref 6.5–8.1)

## 2021-06-11 LAB — CBC
HCT: 33.6 % — ABNORMAL LOW (ref 36.0–46.0)
Hemoglobin: 9.6 g/dL — ABNORMAL LOW (ref 12.0–15.0)
MCH: 20.1 pg — ABNORMAL LOW (ref 26.0–34.0)
MCHC: 28.6 g/dL — ABNORMAL LOW (ref 30.0–36.0)
MCV: 70.4 fL — ABNORMAL LOW (ref 80.0–100.0)
Platelets: 218 10*3/uL (ref 150–400)
RBC: 4.77 MIL/uL (ref 3.87–5.11)
RDW: 23.4 % — ABNORMAL HIGH (ref 11.5–15.5)
WBC: 6.7 10*3/uL (ref 4.0–10.5)
nRBC: 0 % (ref 0.0–0.2)

## 2021-06-11 LAB — URINALYSIS, ROUTINE W REFLEX MICROSCOPIC
Bilirubin Urine: NEGATIVE
Glucose, UA: NEGATIVE mg/dL
Ketones, ur: NEGATIVE mg/dL
Nitrite: NEGATIVE
Protein, ur: 30 mg/dL — AB
RBC / HPF: >50 RBC/hpf — ABNORMAL HIGH (ref 0–5)
Specific Gravity, Urine: 1.018 (ref 1.005–1.030)
pH: 7 (ref 5.0–8.0)

## 2021-06-11 LAB — I-STAT BETA HCG BLOOD, ED (MC, WL, AP ONLY): I-stat hCG, quantitative: <5 m[IU]/mL (ref <5)

## 2021-06-11 LAB — WET PREP, GENITAL
Clue Cells Wet Prep HPF POC: NONE SEEN
Sperm: NONE SEEN
Trich, Wet Prep: NONE SEEN
WBC, Wet Prep HPF POC: <10 (ref <10)
Yeast Wet Prep HPF POC: NONE SEEN

## 2021-06-11 LAB — TYPE AND SCREEN
ABO/RH(D): O NEG
Antibody Screen: NEGATIVE

## 2021-06-11 MED ORDER — MEGESTROL ACETATE 40 MG PO TABS: ORAL_TABLET | ORAL | 0 refills | Status: DC

## 2021-06-11 NOTE — ED Triage Notes (Signed)
Patient here with complaint of vaginal bleeding since December. Patient also complains of a nausea, vomiting, and fatigue since yesterday. Patient is alert, oriented, ambulatory, and in no apparent distress at this time. ?

## 2021-06-11 NOTE — ED Provider Notes (Signed)
?[facility] EM[facility]GENC[facility] ?Provider Note ? ? ?CSN: [id number] ?Arrival date & time: 06/11/21  1017 ? ?  ? ?History ? ?Chief Complaint  ?Patient presents with  ? Vaginal Bleeding  ? ? ?Whitney Robbins is a 31 y.o. female sent in from an outpatient urgent care for evaluation of vaginal bleeding.  Patient states that she has been bleeding since December.  She has a history of abnormal uterine bleeding.  She states that she is not sexually active with single.  1 week ago she had increase in her bleeding and states that she is soaking through an overnight pad every 2-3 hours.  She has felt increased dizziness and fatigue and wanted to come in for further evaluation.  Patient states that there is no possible way she has an STD or pregnancy as she has not been sexually active in years.  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  She is unsure of the first day of her last menstrual period ? ?Vaginal Bleeding ? ?  ? ?Home Medications ?Prior to Admission medications   ?Medication Sig Start Date End Date Taking? Authorizing Provider  ?Calcium Carbonate Antacid (TUMS PO) Take 4 tablets by mouth daily as needed (acid reflux).   Yes [provider]  ?Cyanocobalamin (VITAMIN B 12 PO) Take 1 tablet by mouth daily.   Yes [provider]  ?Ferrous Sulfate (IRON PO) Take 1 tablet by mouth daily.   Yes [provider]  ?FOLIC ACID PO Take 1 tablet by mouth daily.   Yes [provider]  ?Multiple Vitamin (MULTIVITAMIN ADULT PO) Take 1 tablet by mouth daily.   Yes [provider]  ?Probiotic Product (PROBIOTIC PO) Take 1 tablet by mouth daily.   Yes [provider]  ?vitamin C (ASCORBIC ACID) 500 MG tablet Take 1,000 mg by mouth daily.   Yes [provider]  ?famciclovir (FAMVIR) 500 MG tablet Take 500 mg by mouth 3 (three) times daily. ?Patient not taking:  Reported on 06/11/2021 04/18/21   [provider]  ?magic mouthwash SOLN Take 5 mLs by mouth 4 (four) times daily. ?Patient not taking: Reported on 06/11/2021 02/05/17   Enid DerryWagner, Ashley, PA-C  ?naproxen (NAPROSYN) 500 MG tablet Take 1 tablet (500 mg total) by mouth 2 (two) times daily with a meal. ?Patient not taking: Reported on 11/18/2016 08/20/16   Joni ReiningSmith, Ronald K, PA-C  ?omeprazole (PRILOSEC) 40 MG capsule Take 1 capsule (40 mg total) by mouth daily. ?Patient not taking: Reported on 06/11/2021 11/18/16 11/18/17  Myrna BlazerSchaevitz, David Matthew, MD  ?sucralfate (CARAFATE) 1 g tablet Take 1 tablet (1 g total) by mouth 4 (four) times daily. ?Patient not taking: Reported on 06/11/2021 11/18/16 11/18/17  Schaevitz, Myra Rudeavid Matthew, MD  ?traMADol (ULTRAM) 50 MG tablet Take 1 tablet (50 mg total) by mouth every 6 (six) hours as needed for moderate pain. ?Patient not taking: Reported on 11/18/2016 08/20/16   Joni ReiningSmith, Ronald K, PA-C  ?   ? ?Allergies    ?Prednisone   ? ?Review of Systems   ?Review of Systems  ?Genitourinary:  Positive for vaginal bleeding.  ? ?Physical Exam ?Updated Vital Signs ?BP 128/79   Pulse 75   Temp 98 ?F (36.7 ?C) (Oral)   Resp 18   LMP  (LMP Unknown)   SpO2 100%  ?Physical Exam ?Vitals and nursing note reviewed.  ?Constitutional:   ?  General: She is not in acute distress. ?   Appearance: She is well-developed. She is not diaphoretic.  ?HENT:  ?   Head: Normocephalic and atraumatic.  ?   Right Ear: External ear normal.  ?   Left Ear: External ear normal.  ?   Nose: Nose normal.  ?   Mouth/Throat:  ?   Mouth: Mucous membranes are moist.  ?Eyes:  ?   General: No scleral icterus. ?   Conjunctiva/sclera: Conjunctivae normal.  ?Cardiovascular:  ?   Rate and Rhythm: Normal rate and regular rhythm.  ?   Heart sounds: Normal heart sounds. No murmur heard. ?  No friction rub. No gallop.  ?Pulmonary:  ?   Effort: Pulmonary effort is normal. No respiratory distress.  ?   Breath sounds: Normal breath sounds.  ?Abdominal:   ?   General: Bowel sounds are normal. There is no distension.  ?   Palpations: Abdomen is soft. There is no mass.  ?   Tenderness: There is no abdominal tenderness. There is no guarding.  ?Genitourinary: ?   Comments: Pelvic exam: normal external genitalia, vulva, vagina, cervix, Minimal blood from cervical os. Exam limited by patient's habitus ? ? ?Musculoskeletal:  ?   Cervical back: Normal range of motion.  ?Skin: ?   General: Skin is warm and dry.  ?Neurological:  ?   Mental Status: She is alert and oriented to person, place, and time.  ?Psychiatric:     ?   Behavior: Behavior normal.  ? ? ?ED Results / Procedures / Treatments   ?Labs ?(all labs ordered are listed, but only abnormal results are displayed) ?Labs Reviewed  ?COMPREHENSIVE METABOLIC PANEL - Abnormal; Notable for the following components:  ?    Result Value  ? Potassium 3.4 (*)   ? Glucose, Bld 129 (*)   ? AST 44 (*)   ? All other components within normal limits  ?CBC - Abnormal; Notable for the following components:  ? Hemoglobin 9.6 (*)   ? HCT 33.6 (*)   ? MCV 70.4 (*)   ? MCH 20.1 (*)   ? MCHC 28.6 (*)   ? RDW 23.4 (*)   ? All other components within normal limits  ?WET PREP, GENITAL  ?URINALYSIS, ROUTINE W REFLEX MICROSCOPIC  ?HEMOGLOBIN AND HEMATOCRIT, BLOOD  ?I-STAT BETA HCG BLOOD, ED ([facility], [facility], [facility] ONLY)  ?TYPE AND SCREEN  ?GC/CHLAMYDIA PROBE AMP ([place]) NOT AT [facility]  ? ? ?EKG ?None ? ?Radiology ?No results found. ? ?Procedures ?Procedures  ? ? ?Medications Ordered in ED ?Medications - No data to display ? ?ED Course/ Medical Decision Making/ A&P ?Clinical Course as of 06/11/21 1511  ?Fri Jun 11, 2021  ?1451 CBC(!) [AH]  ?1451 Hemoglobin(!): 9.6 ?Last hgb in 2018-. [AH]  ?1452 Comprehensive metabolic panel(!) ?Mild hyperglycemia ? [AH]  ?1507 Wet prep, genital [AH]  ?1511 I-Stat beta hCG blood, ED [AH]  ?  ?Clinical Course User Index ?[AH] Arthor Captain, PA-C  ? ?                        ?Medical Decision Making ?31 y/o F here w/ vaginal  bleeding.  ?Differential diagnosis for nonpregnant vaginal bleeding includes but is not limited to systemic causes such as cirrhosis of the liver, coagulopathy such as ITP or von Willebrand's disease, strep vaginitis, HRT, hypothyroidism, secondary anovulation.  Reproductive tract causes include adenomyosis, atrophic endometrium, dysfunctional uterine bleeding, endometriosis, fibroids, foreign body, infection, IUD,  neoplasia or vaginal trauma. ? ?Patient hgb not sig low. Negative orthostatic vs. Patient has no evidence of infection on wet prep.  ?UA pos for infection. Will tx w/abx. Megace for bleeding and close OP fu ? ?Problems Addressed: ?Abnormal vaginal bleeding: chronic illness or injury ?Acute cystitis without hematuria: acute illness or injury ? ?Amount and/or Complexity of Data Reviewed ?Labs: ordered. Decision-making details documented in ED Course. ? ?Risk ?Prescription drug management. ? ? ? ?Final Clinical Impression(s) / ED Diagnoses ?Final diagnoses:  ?None  ? ? ?Rx / DC Orders ?ED Discharge Orders   ? ? None  ? ?  ? ? ?  ?Arthor Captain, PA-C ?06/14/21 2016 ? ?  ?Melene Plan, DO ?06/15/21 6962 ? ?

## 2021-06-11 NOTE — ED Provider Notes (Signed)
?[facility] ? ? ? ?CSN: [id number] ?Arrival date & time: 06/11/21  0841 ? ? ?  ? ?History   ?Chief Complaint ?Chief Complaint  ?Patient presents with  ? Vaginal Bleeding  ?  n  ? Nausea  ? ? ?HPI ?Whitney Robbins is a 31 y.o. female.  ? ?Patient presents with vaginal bleeding, nausea, fatigue.  Patient reports that she has been having some intermittent light vaginal bleeding since December 2022.  She noticed that the vaginal bleeding became heavier over the past few days and she has developed nausea, dizziness, fatigue.  She reports that she been having to change her menstrual pad approximately every 2-3 hours over the past few days.  Patient denies chance of pregnancy or STD as she has not had sexual intercourse since 2018.  Patient is not sure of last known normal menstrual cycle as she states that she does not have monthly normal menstrual cycles.  Patient also reports that her gynecologist has been suspicious of PCOS but she has not been definitively diagnosed with PCOS.  Patient had ultrasound a few years ago and was told that she had "polyps on her ovaries" but was advised that she did not need to worry about them at that time.  Her mother has had ovarian cancer and had to have complete hysterectomy so she is concerned for this.  She has had some associated urinary frequency and urinary burning over the past few days as well.  She also has elevated blood pressure reading today in urgent care.  She reports that her PCP has been following her blood pressure she does not take any current blood pressure medication.  She does take her blood pressure at home and states that her systolic blood pressure is typically in the 150s.  Denies any associated chest pain, shortness of breath, headache, blurred vision, vomiting. ? ? ?Vaginal Bleeding ? ?Past Medical History:  ?Diagnosis Date  ? Asthma   ? ? ?There are no problems to display for this patient. ? ? ?History reviewed. No pertinent surgical history. ? ?OB  History   ?No obstetric history on file. ?  ? ? ? ?Home Medications   ? ?Prior to Admission medications   ?Medication Sig Start Date End Date Taking? Authorizing Provider  ?magic mouthwash SOLN Take 5 mLs by mouth 4 (four) times daily. 02/05/17   Enid Derry, PA-C  ?naproxen (NAPROSYN) 500 MG tablet Take 1 tablet (500 mg total) by mouth 2 (two) times daily with a meal. ?Patient not taking: Reported on 11/18/2016 08/20/16   Joni Reining, PA-C  ?omeprazole (PRILOSEC) 40 MG capsule Take 1 capsule (40 mg total) by mouth daily. 11/18/16 11/18/17  Myrna Blazer, MD  ?sucralfate (CARAFATE) 1 g tablet Take 1 tablet (1 g total) by mouth 4 (four) times daily. 11/18/16 11/18/17  Schaevitz, Myra Rude, MD  ?traMADol (ULTRAM) 50 MG tablet Take 1 tablet (50 mg total) by mouth every 6 (six) hours as needed for moderate pain. ?Patient not taking: Reported on 11/18/2016 08/20/16   Joni Reining, PA-C  ? ? ?Family History ?History reviewed. No pertinent family history. ? ?Social History ?Social History  ? ?Tobacco Use  ? Smoking status: Never  ? Smokeless tobacco: Never  ?Substance Use Topics  ? Alcohol use: No  ? Drug use: No  ? ? ? ?Allergies   ?Prednisone ? ? ?Review of Systems ?Review of Systems ?Per HPI ? ?Physical Exam ?Triage Vital Signs ?ED Triage Vitals  ?Enc Vitals  Group  ?   BP 06/11/21 0939 (!) 173/116  ?   Pulse Rate 06/11/21 0939 97  ?   Resp 06/11/21 0939 20  ?   Temp 06/11/21 0939 98.2 ?F (36.8 ?C)  ?   Temp Source 06/11/21 0939 Oral  ?   SpO2 06/11/21 0939 99 %  ?   Weight --   ?   Height --   ?   Head Circumference --   ?   Peak Flow --   ?   Pain Score 06/11/21 0938 4  ?   Pain Loc --   ?   Pain Edu? --   ?   Excl. in GC? --   ? ?No data found. ? ?Updated Vital Signs ?BP (!) 173/116 (BP Location: Left Arm)   Pulse 97   Temp 98.2 ?F (36.8 ?C) (Oral)   Resp 20   LMP  (LMP Unknown)   SpO2 99%  ? ?Visual Acuity ?Right Eye Distance:   ?Left Eye Distance:   ?Bilateral Distance:   ? ?Right Eye Near:    ?Left Eye Near:    ?Bilateral Near:    ? ?Physical Exam ?Constitutional:   ?   General: She is not in acute distress. ?   Appearance: Normal appearance. She is not toxic-appearing or diaphoretic.  ?HENT:  ?   Head: Normocephalic and atraumatic.  ?Eyes:  ?   Extraocular Movements: Extraocular movements intact.  ?   Conjunctiva/sclera: Conjunctivae normal.  ?Cardiovascular:  ?   Rate and Rhythm: Normal rate and regular rhythm.  ?   Pulses: Normal pulses.  ?   Heart sounds: Normal heart sounds.  ?Pulmonary:  ?   Effort: Pulmonary effort is normal. No respiratory distress.  ?   Breath sounds: Normal breath sounds.  ?Genitourinary: ?   Comments: Deferred with shared decision making. Self swab performed.  ?Neurological:  ?   General: No focal deficit present.  ?   Mental Status: She is alert and oriented to person, place, and time. Mental status is at baseline.  ?   Cranial Nerves: Cranial nerves 2-12 are intact.  ?   Sensory: Sensation is intact.  ?   Motor: Motor function is intact.  ?   Coordination: Coordination is intact.  ?   Gait: Gait is intact.  ?Psychiatric:     ?   Mood and Affect: Mood normal.     ?   Behavior: Behavior normal.     ?   Thought Content: Thought content normal.     ?   Judgment: Judgment normal.  ? ? ? ?UC Treatments / Results  ?Labs ?(all labs ordered are listed, but only abnormal results are displayed) ?Labs Reviewed - No data to display ? ?EKG ? ? ?Radiology ?No results found. ? ?Procedures ?Procedures (including critical care time) ? ?Medications Ordered in UC ?Medications - No data to display ? ?Initial Impression / Assessment and Plan / UC Course  ?I have reviewed the triage vital signs and the nursing notes. ? ?Pertinent labs & imaging results that were available during my care of the patient were reviewed by me and considered in my medical decision making (see chart for details). ? ?  ? ?Patient denies any recent sexual activity in the past few years so do not think that urine pregnancy  or STD testing is necessary at this time.  Due to patient's family history of ovarian cancer and patient's report of "polyps on her ovaries" in the past,  I do think the patient would benefit from a transvaginal ultrasound to determine cause of vaginal bleeding as well as CBC.  Patient was advised that I do not have transvaginal ultrasound in urgent care.  Advised patient that urine test and blood work can be completed today and she will need to follow-up with gynecology to have further evaluation and management with possible ultrasound but patient reported that she has been trying to get in with gynecology but there are no appointments sooner than June.  Therefore, patient was advised to that due to heavy vaginal bleeding with associated dizziness and fatigue as well as elevated blood pressure, it would be reasonable for patient to go to the ER for further evaluation and management to obtain stat blood work and possible transvaginal ultrasound.  Patient was agreeable with plan and would rather go to the ER as opposed to waiting for gynecology appointment.  Patient left via self transport. ?Final Clinical Impressions(s) / UC Diagnoses  ? ?Final diagnoses:  ?Vaginal bleeding  ?Nausea  ?Dizziness and giddiness  ?Elevated blood pressure reading  ? ? ? ?Discharge Instructions   ? ?  ?Please go to the emergency room as soon as you leave urgent care for further evaluation and management. ? ? ? ?ED Prescriptions   ?None ?  ? ?PDMP not reviewed this encounter. ?  ?Gustavus BryantMound, Yao Hyppolite E, [place]NP ?06/11/21 1010 ? ?

## 2021-06-11 NOTE — Discharge Instructions (Addendum)
These call the [facility] is soon as possible to schedule a follow-up appointment.Get help right away if: ?You faint. ?You have bleeding that soaks through a sanitary pad every hour. ?You have pain in the abdomen. ?You have a fever or chills. ?You become sweaty or weak. ?You pass large blood clots from your vagina. ?

## 2021-06-11 NOTE — ED Provider Triage Note (Signed)
Emergency Medicine Provider Triage Evaluation Note ? ?Whitney Robbins , a 31 y.o. female  was evaluated in triage.  Pt complains of vaginal bleeding and abdominal cramping. States that same initially began in December and has been worsening since then with acute worsening in the past 2 weeks.  She states that she is having to change her extra large pads every 1-2 hours. States she has been feeling fatigued for the past few days as well. Hx of abnormal menstrual cycles in the past, had an ultrasound in 2016 that showed 'ovarian polyps.' States that her pain is intermittent in nature time she was in pain was last night she states that it felt like ' someone was stabbing him ice pick in my vagina.'  Denies fevers, chills, nausea, vomiting ? ?Review of Systems  ?Positive:  ?Negative: See above ? ?Physical Exam  ?BP (!) 195/111 (BP Location: Right Arm)   Pulse 100   Temp 98.5 ?F (36.9 ?C) (Oral)   Resp (!) 22   LMP  (LMP Unknown)   SpO2 99%  ?Gen:   Awake, no distress   ?Resp:  Normal effort  ?MSK:   Moves extremities without difficulty  ?Other:   ? ?Medical Decision Making  ?Medically screening exam initiated at 10:44 AM.  Appropriate orders placed.  Whitney Robbins was informed that the remainder of the evaluation will be completed by another provider, this initial triage assessment does not replace that evaluation, and the importance of remaining in the ED until their evaluation is complete. ? ? ?  ?Bud Face, PA-C ?06/11/21 1049 ? ?

## 2021-06-11 NOTE — ED Triage Notes (Signed)
Pt presents with c/o heavy vaginal bleeding since December. Pt states she has felt tired, dizzy and nauseas.  ?

## 2021-06-11 NOTE — Discharge Instructions (Signed)
Please go to the emergency room as soon as you leave urgent care for further evaluation and management. ?

## 2021-06-11 NOTE — ED Notes (Signed)
Patient is being discharged from the Urgent Care and sent to the Emergency Department via POV . Per Rolly Salter FNP, patient is in need of higher level of care due to heavy vaginal bleeding and dizziness. Patient is aware and verbalizes understanding of plan of care.  ?Vitals:  ? 06/11/21 0939  ?BP: (!) 173/116  ?Pulse: 97  ?Resp: 20  ?Temp: 98.2 ?F (36.8 ?C)  ?SpO2: 99%  ?  ?

## 2021-06-11 NOTE — ED Notes (Signed)
Pt denies soaking more than one pad an hour 

## 2021-06-14 LAB — GC/CHLAMYDIA PROBE AMP (~~LOC~~) NOT AT ARMC
Chlamydia: NEGATIVE
Comment: NEGATIVE
Comment: NORMAL
Neisseria Gonorrhea: NEGATIVE

## 2021-06-16 ENCOUNTER — Ambulatory Visit (INDEPENDENT_AMBULATORY_CARE_PROVIDER_SITE_OTHER): Payer: 59 | Admitting: Obstetrics

## 2021-06-16 ENCOUNTER — Encounter: Payer: Self-pay | Admitting: Obstetrics

## 2021-06-16 ENCOUNTER — Other Ambulatory Visit (HOSPITAL_COMMUNITY)
Admission: RE | Admit: 2021-06-16 | Discharge: 2021-06-16 | Disposition: A | Discharge status: Home / Self Care | Payer: 59 | Source: Ambulatory Visit | Attending: Obstetrics | Admitting: Obstetrics

## 2021-06-16 VITALS — BP 178/102 | HR 100 | Ht 65.0 in | Wt 294.0 lb

## 2021-06-16 DIAGNOSIS — Z01419 Encounter for gynecological examination (general) (routine) without abnormal findings: Secondary | ICD-10-CM | POA: Insufficient documentation

## 2021-06-16 DIAGNOSIS — N939 Abnormal uterine and vaginal bleeding, unspecified: Secondary | ICD-10-CM

## 2021-06-16 DIAGNOSIS — I10 Essential (primary) hypertension: Secondary | ICD-10-CM | POA: Diagnosis not present

## 2021-06-16 DIAGNOSIS — Z6841 Body Mass Index (BMI) 40.0 and over, adult: Secondary | ICD-10-CM

## 2021-06-16 NOTE — Progress Notes (Signed)
? ?Subjective: ? ? ?  ?  ? Whitney Robbins is a 31 y.o. female here for a routine exam.  Current complaints: None.   ? ?Personal health questionnaire:  ?Is patient Ashkenazi Jewish, have a family history of breast and/or ovarian cancer: no ?Is there a family history of uterine cancer diagnosed at age < 41, gastrointestinal cancer, urinary tract cancer, family member who is a Personnel officer syndrome-associated carrier: no ?Is the patient overweight and hypertensive, family history of diabetes, personal history of gestational diabetes, preeclampsia or PCOS: yes ?Is patient over 53, have PCOS,  family history of premature CHD under age 6, diabetes, smoke, have hypertension or peripheral artery disease:  no ?At any time, has a partner hit, kicked or otherwise hurt or frightened you?: no ?Over the past 2 weeks, have you felt down, depressed or hopeless?: no ?Over the past 2 weeks, have you felt [place] interest or pleasure in doing things?:no ? ? ?Gynecologic History ?Patient's last menstrual period was 02/23/2021. ?Contraception: none ?Last Pap: ~ 2 yrs ago. Results were: normal ?Last mammogram: n/a. Results were: n/a ? ?Obstetric History ?OB History  ?Gravida Para Term Preterm AB Living  ?0 0 0 0 0 0  ?SAB IAB Ectopic Multiple Live Births  ?0 0 0 0 0  ? ? ?Past Medical History:  ?Diagnosis Date  ? Anemia   ? Anxiety   ? Asthma   ?  ?History reviewed. No pertinent surgical history.  ? ?Current Outpatient Medications:  ?  megestrol (MEGACE) 40 MG tablet, 3 tabs a day for 5 days(all at once) 2 tabs a day for 5 days(all at once) then 1 tablet a day, Disp: 45 tablet, Rfl: 0 ?  Calcium Carbonate Antacid (TUMS PO), Take 4 tablets by mouth daily as needed (acid reflux)., Disp: , Rfl:  ?  Cyanocobalamin (VITAMIN B 12 PO), Take 1 tablet by mouth daily., Disp: , Rfl:  ?  famciclovir (FAMVIR) 500 MG tablet, Take 500 mg by mouth 3 (three) times daily. (Patient not taking: Reported on 06/11/2021), Disp: , Rfl:  ?  Ferrous Sulfate (IRON PO),  Take 1 tablet by mouth daily., Disp: , Rfl:  ?  FOLIC ACID PO, Take 1 tablet by mouth daily., Disp: , Rfl:  ?  magic mouthwash SOLN, Take 5 mLs by mouth 4 (four) times daily. (Patient not taking: Reported on 06/11/2021), Disp: 100 mL, Rfl: 0 ?  Multiple Vitamin (MULTIVITAMIN ADULT PO), Take 1 tablet by mouth daily., Disp: , Rfl:  ?  naproxen (NAPROSYN) 500 MG tablet, Take 1 tablet (500 mg total) by mouth 2 (two) times daily with a meal. (Patient not taking: Reported on 11/18/2016), Disp: 20 tablet, Rfl: 00 ?  omeprazole (PRILOSEC) 40 MG capsule, Take 1 capsule (40 mg total) by mouth daily. (Patient not taking: Reported on 06/11/2021), Disp: 15 capsule, Rfl: 0 ?  Probiotic Product (PROBIOTIC PO), Take 1 tablet by mouth daily., Disp: , Rfl:  ?  sucralfate (CARAFATE) 1 g tablet, Take 1 tablet (1 g total) by mouth 4 (four) times daily. (Patient not taking: Reported on 06/11/2021), Disp: 60 tablet, Rfl: 0 ?  traMADol (ULTRAM) 50 MG tablet, Take 1 tablet (50 mg total) by mouth every 6 (six) hours as needed for moderate pain. (Patient not taking: Reported on 11/18/2016), Disp: 12 tablet, Rfl: 0 ?  vitamin C (ASCORBIC ACID) 500 MG tablet, Take 1,000 mg by mouth daily., Disp: , Rfl:  ?Allergies  ?Allergen Reactions  ? Prednisone Itching and Swelling  ?  Causes redness of skin  ?  ?Social History  ? ?Tobacco Use  ? Smoking status: Never  ? Smokeless tobacco: Never  ?Substance Use Topics  ? Alcohol use: No  ?  ?Family History  ?Problem Relation Age of Onset  ? Cancer Mother   ? Cancer Maternal Uncle   ? Cancer Maternal Grandmother   ?  ? ? ?Review of Systems ? ?Constitutional: negative for fatigue and weight loss ?Respiratory: negative for cough and wheezing ?Cardiovascular: negative for chest pain, fatigue and palpitations ?Gastrointestinal: negative for abdominal pain and change in bowel habits ?Musculoskeletal:negative for myalgias ?Neurological: negative for gait problems and tremors ?Behavioral/Psych: negative for abusive  relationship, depression ?Endocrine: negative for temperature intolerance    ?Genitourinary: positive for abnormal menstrual periods.  Negative for genital lesions, hot flashes, sexual problems and vaginal discharge ?Integument/breast: negative for breast lump, breast tenderness, nipple discharge and skin lesion(s) ? ?  ?Objective:  ? ?    ?BP (!) 178/102   Pulse 100   Ht 5\' 5"  (1.651 m)   Wt 294 lb (133.4 kg)   LMP 02/23/2021   BMI 48.92 kg/m?  ?General:   Alert and no distress  ?Skin:   no rash or abnormalities  ?Lungs:   clear to auscultation bilaterally  ?Heart:   regular rate and rhythm, S1, S2 normal, no murmur, click, rub or gallop  ?Breasts:   normal without suspicious masses, skin or nipple changes or axillary nodes  ?Abdomen:  normal findings: no organomegaly, soft, non-tender and no hernia  ?Pelvis:  External genitalia: normal general appearance ?Urinary system: urethral meatus normal and bladder without fullness, nontender ?Vaginal: normal without tenderness, induration or masses ?Cervix: normal appearance ?Adnexa: normal bimanual exam ?Uterus: anteverted and non-tender, normal size  ? ?Lab Review ?Urine pregnancy test ?Labs reviewed yes ?Radiologic studies reviewed no ? ?I have spent a total of 20 minutes of face-to-face time, excluding clinical staff time, reviewing notes and preparing to see patient, ordering tests and/or medications, and counseling the patient.  ? ?Assessment:  ? ? 1. Encounter for routine gynecological examination with Papanicolaou smear of cervix ?Rx: ?- Cytology - PAP( [place]) ? ?2. Abnormal uterine bleeding (AUB) ?Rx: ?- 02/25/2021 PELVIC COMPLETE WITH TRANSVAGINAL; Future ? ?3. HTN (hypertension), benign ?- uncontrolled.  Managed by PCP ? ?4. Class 3 severe obesity due to excess calories without serious comorbidity with body mass index (BMI) of 45.0 to 49.9 in adult [facility]) ?- weight reduction with the aid of dietary changes, exercise and behavioral modification recommended ?  ?   ?Plan:  ? ? Education reviewed: calcium supplements, depression evaluation, low fat, low cholesterol diet, safe sex/STD prevention, self breast exams, and weight bearing exercise. ?Follow up in: 3 weeks.  ? ? ?Orders Placed This Encounter  ?Procedures  ? [facility] PELVIC COMPLETE WITH TRANSVAGINAL  ?  Standing Status:   Future  ?  Standing Expiration Date:   06/17/2022  ?  Order Specific Question:   Reason for Exam (SYMPTOM  OR DIAGNOSIS REQUIRED)  ?  Answer:   AUB  ?  Order Specific Question:   Preferred imaging location?  ?  Answer:   [id number] 09-19-1972  ? ? ? Samson Frederic, MD ?06/16/2021 10:06 AM  ?

## 2021-06-16 NOTE — Progress Notes (Signed)
31 y.o New GYN presents for AUB, c/o heavy bleeding since December to present.  It stop for a few days and started again, and she is changing overnight pads every 2-3 hours, cramps 8-9/10 and  large blood clots. ?

## 2021-06-17 LAB — CYTOLOGY - PAP
Comment: NEGATIVE
Diagnosis: NEGATIVE
High risk HPV: NEGATIVE

## 2021-06-21 ENCOUNTER — Ambulatory Visit
Admission: RE | Admit: 2021-06-21 | Discharge: 2021-06-21 | Disposition: A | Discharge status: Home / Self Care | Payer: 59 | Source: Ambulatory Visit | Attending: Obstetrics | Admitting: Obstetrics

## 2021-06-21 DIAGNOSIS — N939 Abnormal uterine and vaginal bleeding, unspecified: Secondary | ICD-10-CM

## 2021-07-09 ENCOUNTER — Ambulatory Visit: Payer: 59 | Admitting: Family

## 2021-08-02 ENCOUNTER — Encounter: Payer: Self-pay | Admitting: Obstetrics

## 2021-08-04 ENCOUNTER — Other Ambulatory Visit: Payer: Self-pay | Admitting: Obstetrics

## 2021-08-04 DIAGNOSIS — R9389 Abnormal findings on diagnostic imaging of other specified body structures: Secondary | ICD-10-CM

## 2021-08-04 DIAGNOSIS — N939 Abnormal uterine and vaginal bleeding, unspecified: Secondary | ICD-10-CM

## 2021-08-04 MED ORDER — MEGESTROL ACETATE 40 MG PO TABS: 80.0000 mg | ORAL_TABLET | Freq: Two times a day (BID) | ORAL | 1 refills | Status: DC

## 2021-10-05 ENCOUNTER — Other Ambulatory Visit: Payer: Self-pay | Admitting: Obstetrics

## 2021-10-05 DIAGNOSIS — R9389 Abnormal findings on diagnostic imaging of other specified body structures: Secondary | ICD-10-CM

## 2021-10-05 DIAGNOSIS — N939 Abnormal uterine and vaginal bleeding, unspecified: Secondary | ICD-10-CM

## 2022-10-10 ENCOUNTER — Other Ambulatory Visit: Payer: Self-pay | Admitting: Obstetrics & Gynecology

## 2022-10-10 ENCOUNTER — Encounter (HOSPITAL_BASED_OUTPATIENT_CLINIC_OR_DEPARTMENT_OTHER): Payer: Self-pay | Admitting: Obstetrics & Gynecology

## 2022-10-10 NOTE — Progress Notes (Addendum)
Addendum:  pt rescheduled from 10-14-2022 to 11-04-2022.  Pt verbalized to arrive at 0600 npo after mn w/ exception clear liquids until 0500.  Needs CBC, T&S, Istat, EKG, urine preg in pre-op. Will take norvasc/ norethindrone am dos.  Mother, Annice Pih, will be drive/ caregiver.  Spoke w/ via phone for pre-op interview--- pt Lab needs dos---- [employer], urine preg, ekg              Lab results------ no COVID test -----patient states asymptomatic no test needed Arrive at ------- 1200 on 10-14-2022 NPO after MN NO Solid Food.  Clear liquids from MN until--- 1100 Med rec completed Medications to take morning of surgery ----- norvasc, norethindrone Diabetic medication ----- n/a Patient instructed no nail polish to be worn day of surgery Patient instructed to bring photo id and insurance card day of surgery Patient aware to have Driver (ride ) / caregiver    for 24 hours after surgery -- mother, jackie Patient Special Instructions ----- asked pt to bring rescue inhaler dos Pre-Op special Instructions -----  pre-op orders need second sign,  case just posted today Patient verbalized understanding of instructions that were given at this phone interview. Patient denies shortness of breath, chest pain, fever, cough at this phone interview.

## 2022-10-12 ENCOUNTER — Encounter (HOSPITAL_COMMUNITY): Payer: Self-pay | Admitting: Certified Registered Nurse Anesthetist

## 2022-10-13 ENCOUNTER — Other Ambulatory Visit: Payer: Self-pay | Admitting: Obstetrics & Gynecology

## 2022-10-14 ENCOUNTER — Other Ambulatory Visit: Payer: Self-pay | Admitting: Obstetrics & Gynecology

## 2022-10-14 DIAGNOSIS — Z01818 Encounter for other preprocedural examination: Secondary | ICD-10-CM

## 2022-10-27 ENCOUNTER — Encounter (HOSPITAL_BASED_OUTPATIENT_CLINIC_OR_DEPARTMENT_OTHER): Payer: Self-pay | Admitting: Obstetrics & Gynecology

## 2022-11-02 NOTE — H&P (Addendum)
Whitney Robbins is an 32 y.o. female with irregular and prolonged periods, found to have thickened endometrial lining on ultrasound, who failed in office attempt of endometrial biopsy and Mirena Intrauterine device (IUD) placement here for Dilation and Curettage Hysteroscopy, possible myosure and Mirena IUD placement.   Pertinent Gynecological History: Menses:  Irregular, every 1 to 2 months.  Bleeding: prolonged for up to 1 month.  Contraception: abstinence DES exposure: unknown Blood transfusions: none Sexually transmitted diseases: no past history Previous GYN Procedures:  None   Last mammogram:  N/A Last pap: normal Date: 09/27/22 OB History: G0, P0   Menstrual History: Menarche age: 7 Patient's last menstrual period was 10/09/2022 (exact date).    Past Medical History:  Diagnosis Date   Anxiety    History of gastroesophageal reflux (GERD)    Hypertension    IDA (iron deficiency anemia)    Mild asthma    Pre-diabetes    Thickened endometrium    Vitamin D deficiency    Wears glasses     Past Surgical History:  Procedure Laterality Date   ADENOIDECTOMY AND MYRINGOTOMY WITH TUBE PLACEMENT Bilateral 1994   TOOTH EXTRACTION      Family History  Problem Relation Age of Onset   Cancer Mother    Cancer Maternal Uncle    Cancer Maternal Grandmother     Social History:  reports that she has never smoked. She has never used smokeless tobacco. She reports that she does not drink alcohol and does not use drugs.  Allergies:  Allergies  Allergen Reactions   Prednisone Itching and Swelling    Causes redness of skin (facial flushing)    Current Outpatient Medications  Medication Instructions   albuterol (VENTOLIN HFA) 108 (90 Base) MCG/ACT inhaler Inhalation, Every 6 hours PRN   amLODipine (NORVASC) 10 mg, Oral, Daily   Calcium Carb-Cholecalciferol 600-25 MG-MCG CAPS 1 capsule, Oral, Daily at bedtime   Cyanocobalamin (VITAMIN B 12 PO) 1 tablet, Oral, Daily    ergocalciferol (VITAMIN D2) 50,000 Units, Oral, Weekly, Wednesday's    Ferrous Sulfate (IRON PO) 1 tablet, Oral, Daily   FOLIC ACID PO 1 tablet, Oral, Daily   Multiple Vitamin (MULTIVITAMIN ADULT PO) 1 tablet, Oral, Daily at bedtime   norethindrone (AYGESTIN) 5 mg, Oral, Daily   vitamin C 1,000 mg, Oral, Daily    Review of Systems Constitutional: Denies fevers/chills Cardiovascular: Denies chest pain or palpitations Pulmonary: Denies coughing or wheezing Gastrointestinal: Denies nausea, vomiting or diarrhea Genitourinary: Denies pelvic pain, unusual vaginal discharge, dysuria, urgency or frequency. With unusual vaginal bleeding.   Musculoskeletal: Denies muscle or joint aches and pain.  Neurology: Denies abnormal sensations such as tingling or numbness.    Height 5\' 5"  (1.651 m), weight 134.3 kg, last menstrual period 10/09/2022. Blood pressure (!) 179/109, pulse 84, temperature 98.3 F (36.8 C), temperature source Oral, resp. rate 18, height 5\' 5"  (1.651 m), weight 134.4 kg, last menstrual period 10/09/2022, SpO2 96%.  Physical Exam Constitutional: She is oriented to person, place, and time. She appears well-developed and well-nourished.  HENT:  Head: Normocephalic and atraumatic.  Neck: Normal range of motion.  Cardiovascular: Normal rate.    Respiratory: Effort normal.   GI: Soft.  Skin: Skin is warm and dry.  Psychiatric: She has a normal mood and affect. Her behavior is normal.   Genitourinary: Posterior and stetonic cervix.      Current Outpatient Medications  Medication Instructions   albuterol (VENTOLIN HFA) 108 (90 Base) MCG/ACT inhaler Inhalation, Every 6  hours PRN   amLODipine (NORVASC) 10 mg, Oral, Daily   Calcium Carb-Cholecalciferol 600-25 MG-MCG CAPS 1 capsule, Oral, Daily at bedtime   Cyanocobalamin (VITAMIN B 12 PO) 1 tablet, Oral, Daily   ergocalciferol (VITAMIN D2) 50,000 Units, Oral, Weekly, Wednesday's    Ferrous Sulfate (IRON PO) 1 tablet, Oral, Daily    FOLIC ACID PO 1 tablet, Oral, Daily   Multiple Vitamin (MULTIVITAMIN ADULT PO) 1 tablet, Oral, Daily at bedtime   norethindrone (AYGESTIN) 5 mg, Oral, Daily   vitamin C 1,000 mg, Oral, Daily    Allergies  Allergen Reactions   Prednisone Itching and Swelling    Causes redness of skin (facial flushing)     Recent Results (from the past 2160 hour(s))  Pregnancy, urine POC     Status: None   Collection Time: 11/04/22  5:59 AM  Result Value Ref Range   Preg Test, Ur NEGATIVE NEGATIVE    Comment:        THE SENSITIVITY OF THIS METHODOLOGY IS >24 mIU/mL   Type and screen     Status: None   Collection Time: 11/04/22  6:29 AM  Result Value Ref Range   ABO/RH(D) O NEG    Antibody Screen NEG    Sample Expiration      11/07/2022,2359 Performed at [facility], [address] [place], [place] [address]   [address]ie [address]     Status: Abnormal   Collection Time: 11/04/22  6:37 AM  Result Value Ref Range   Sodium 140 135 - 145 mmol/L   Potassium 3.6 3.5 - 5.1 mmol/L   Chloride 100 98 - 111 mmol/L   BUN 10 6 - 20 mg/dL   Creatinine, Ser 6.96 0.44 - 1.00 mg/dL   Glucose, Bld 295 (H) 70 - 99 mg/dL    Comment: Glucose reference range applies only to samples taken after fasting for at least 8 hours.   Calcium, Ion 1.15 1.15 - 1.40 mmol/L   TCO2 27 22 - 32 mmol/L   Hemoglobin 15.6 (H) 12.0 - 15.0 g/dL   HCT 28.4 13.2 - 44.0 %    Assessment/Plan: 32 y/o G0 with irregular and prolonged periods here for dilation and curettage hysteroscopy possible myosure and Mirena IUD insertion. - Admit to [facility]- outpatient.  - This procedure has been fully reviewed with the patient and written informed consent has been obtained. We discussed  Risks, benefits and alternatives of the procedure including but not limited to risks of bleeding, infection and damage to organs.  Prescilla Sours, MD.  11/04/2022, 8:05 AM.

## 2022-11-03 NOTE — Anesthesia Preprocedure Evaluation (Addendum)
Anesthesia Evaluation  Patient identified by MRN, date of birth, ID band Patient awake    Reviewed: Allergy & Precautions, NPO status , Patient's Chart, lab work & pertinent test results  Airway Mallampati: III  TM Distance: >3 FB Neck ROM: Full    Dental no notable dental hx. (+) Teeth Intact, Dental Advisory Given   Pulmonary asthma    Pulmonary exam normal breath sounds clear to auscultation       Cardiovascular hypertension, Normal cardiovascular exam Rhythm:Regular Rate:Normal     Neuro/Psych    GI/Hepatic ,GERD  ,,  Endo/Other    Morbid obesity  Renal/GU Lab Results      Component                Value               Date                                K                        3.6                 11/04/2022              BUN                      10                  11/04/2022                CREATININE               0.70                11/04/2022                GFRNONAA                 >60                 06/11/2021                           GLUCOSE                  116 (H)             11/04/2022                Musculoskeletal   Abdominal   Peds  Hematology Lab Results      Component                Value               Date                      HGB                      15.6 (H)            11/04/2022                HCT                      46.0  11/04/2022                     Anesthesia Other Findings   Reproductive/Obstetrics                              Anesthesia Physical Anesthesia Plan  ASA: 3  Anesthesia Plan: General   Post-op Pain Management: Toradol IV (intra-op)*, Tylenol PO (pre-op)* and Precedex   Induction: Intravenous  PONV Risk Score and Plan: 4 or greater and Treatment may vary due to age or medical condition, Midazolam and Ondansetron  Airway Management Planned: LMA  Additional Equipment: None  Intra-op Plan:   Post-operative Plan:    Informed Consent: I have reviewed the patients History and Physical, chart, labs and discussed the procedure including the risks, benefits and alternatives for the proposed anesthesia with the patient or authorized representative who has indicated his/her understanding and acceptance.     Dental advisory given  Plan Discussed with: CRNA  Anesthesia Plan Comments:        Anesthesia Quick Evaluation

## 2022-11-04 ENCOUNTER — Encounter (HOSPITAL_BASED_OUTPATIENT_CLINIC_OR_DEPARTMENT_OTHER): Payer: Self-pay | Admitting: Obstetrics & Gynecology

## 2022-11-04 ENCOUNTER — Encounter (HOSPITAL_BASED_OUTPATIENT_CLINIC_OR_DEPARTMENT_OTHER)
Admission: RE | Disposition: A | Discharge status: Home / Self Care | Payer: Self-pay | Source: Home / Self Care | Attending: Obstetrics & Gynecology

## 2022-11-04 ENCOUNTER — Ambulatory Visit (HOSPITAL_BASED_OUTPATIENT_CLINIC_OR_DEPARTMENT_OTHER): Payer: 59 | Admitting: Anesthesiology

## 2022-11-04 ENCOUNTER — Ambulatory Visit (HOSPITAL_BASED_OUTPATIENT_CLINIC_OR_DEPARTMENT_OTHER)
Admission: RE | Admit: 2022-11-04 | Discharge: 2022-11-04 | Disposition: A | Discharge status: Home / Self Care | Payer: 59 | Attending: Obstetrics & Gynecology | Admitting: Obstetrics & Gynecology

## 2022-11-04 DIAGNOSIS — N921 Excessive and frequent menstruation with irregular cycle: Secondary | ICD-10-CM | POA: Diagnosis present

## 2022-11-04 DIAGNOSIS — N882 Stricture and stenosis of cervix uteri: Secondary | ICD-10-CM | POA: Diagnosis not present

## 2022-11-04 DIAGNOSIS — Z6841 Body Mass Index (BMI) 40.0 and over, adult: Secondary | ICD-10-CM | POA: Insufficient documentation

## 2022-11-04 DIAGNOSIS — Z01818 Encounter for other preprocedural examination: Secondary | ICD-10-CM

## 2022-11-04 DIAGNOSIS — I10 Essential (primary) hypertension: Secondary | ICD-10-CM | POA: Insufficient documentation

## 2022-11-04 DIAGNOSIS — J45909 Unspecified asthma, uncomplicated: Secondary | ICD-10-CM | POA: Diagnosis not present

## 2022-11-04 DIAGNOSIS — R9389 Abnormal findings on diagnostic imaging of other specified body structures: Secondary | ICD-10-CM | POA: Diagnosis not present

## 2022-11-04 DIAGNOSIS — N8500 Endometrial hyperplasia, unspecified: Secondary | ICD-10-CM | POA: Diagnosis not present

## 2022-11-04 HISTORY — DX: Personal history of other diseases of the digestive system: Z87.19

## 2022-11-04 HISTORY — DX: Unspecified asthma, uncomplicated: J45.909

## 2022-11-04 HISTORY — DX: Vitamin D deficiency, unspecified: E55.9

## 2022-11-04 HISTORY — DX: Essential (primary) hypertension: I10

## 2022-11-04 HISTORY — DX: Prediabetes: R73.03

## 2022-11-04 HISTORY — PX: INTRAUTERINE DEVICE (IUD) INSERTION: SHX5877

## 2022-11-04 HISTORY — DX: Presence of spectacles and contact lenses: Z97.3

## 2022-11-04 HISTORY — PX: DILATATION & CURETTAGE/HYSTEROSCOPY WITH MYOSURE: SHX6511

## 2022-11-04 HISTORY — DX: Iron deficiency anemia, unspecified: D50.9

## 2022-11-04 HISTORY — DX: Abnormal findings on diagnostic imaging of other specified body structures: R93.89

## 2022-11-04 LAB — POCT I-STAT, CHEM 8
BUN: 10 mg/dL (ref 6–20)
Calcium, Ion: 1.15 mmol/L (ref 1.15–1.40)
Chloride: 100 mmol/L (ref 98–111)
Creatinine, Ser: 0.7 mg/dL (ref 0.44–1.00)
Glucose, Bld: 116 mg/dL — ABNORMAL HIGH (ref 70–99)
HCT: 46 % (ref 36.0–46.0)
Hemoglobin: 15.6 g/dL — ABNORMAL HIGH (ref 12.0–15.0)
Potassium: 3.6 mmol/L (ref 3.5–5.1)
Sodium: 140 mmol/L (ref 135–145)
TCO2: 27 mmol/L (ref 22–32)

## 2022-11-04 LAB — TYPE AND SCREEN
ABO/RH(D): O NEG
Antibody Screen: NEGATIVE

## 2022-11-04 LAB — POCT PREGNANCY, URINE: Preg Test, Ur: NEGATIVE

## 2022-11-04 SURGERY — DILATATION & CURETTAGE/HYSTEROSCOPY WITH MYOSURE
Anesthesia: General | Site: Uterus

## 2022-11-04 SURGERY — DILATATION & CURETTAGE/HYSTEROSCOPY WITH MYOSURE
Anesthesia: General

## 2022-11-04 MED ORDER — ONDANSETRON HCL 4 MG/2ML IJ SOLN
INTRAMUSCULAR | Status: AC
  Filled 2022-11-04: qty 4

## 2022-11-04 MED ORDER — OXYCODONE HCL 5 MG PO TABS: 5.0000 mg | ORAL_TABLET | Freq: Once | ORAL | Status: DC | PRN

## 2022-11-04 MED ORDER — KETOROLAC TROMETHAMINE 30 MG/ML IJ SOLN
INTRAMUSCULAR | Status: AC
  Filled 2022-11-04: qty 2

## 2022-11-04 MED ORDER — IBUPROFEN 800 MG PO TABS: 800.0000 mg | ORAL_TABLET | Freq: Three times a day (TID) | ORAL | 0 refills | Status: AC | PRN

## 2022-11-04 MED ORDER — FENTANYL CITRATE (PF) 100 MCG/2ML IJ SOLN
INTRAMUSCULAR | Status: AC
  Filled 2022-11-04: qty 2

## 2022-11-04 MED ORDER — PROPOFOL 10 MG/ML IV BOLUS
INTRAVENOUS | Status: AC
  Filled 2022-11-04: qty 20

## 2022-11-04 MED ORDER — ONDANSETRON HCL 4 MG/2ML IJ SOLN: 4.0000 mg | Freq: Once | INTRAMUSCULAR | Status: DC | PRN

## 2022-11-04 MED ORDER — LIDOCAINE 2% (20 MG/ML) 5 ML SYRINGE
INTRAMUSCULAR | Status: DC | PRN
  Administered 2022-11-04: 100 mg via INTRAVENOUS

## 2022-11-04 MED ORDER — ACETAMINOPHEN 500 MG PO TABS
ORAL_TABLET | ORAL | Status: AC
  Filled 2022-11-04: qty 2

## 2022-11-04 MED ORDER — HYDROMORPHONE HCL 1 MG/ML IJ SOLN: 0.2500 mg | INTRAMUSCULAR | Status: DC | PRN

## 2022-11-04 MED ORDER — PHENYLEPHRINE 80 MCG/ML (10ML) SYRINGE FOR IV PUSH (FOR BLOOD PRESSURE SUPPORT)
PREFILLED_SYRINGE | INTRAVENOUS | Status: AC
  Filled 2022-11-04: qty 10

## 2022-11-04 MED ORDER — KETOROLAC TROMETHAMINE 30 MG/ML IJ SOLN
INTRAMUSCULAR | Status: DC | PRN
Start: 2022-11-04 — End: 2022-11-04
  Administered 2022-11-04: 30 mg via INTRAVENOUS

## 2022-11-04 MED ORDER — ROCURONIUM BROMIDE 10 MG/ML (PF) SYRINGE
PREFILLED_SYRINGE | INTRAVENOUS | Status: AC
  Filled 2022-11-04: qty 10

## 2022-11-04 MED ORDER — MIDAZOLAM HCL 2 MG/2ML IJ SOLN
INTRAMUSCULAR | Status: DC | PRN
  Administered 2022-11-04: 2 mg via INTRAVENOUS

## 2022-11-04 MED ORDER — BUPIVACAINE-EPINEPHRINE 0.5% -1:200000 IJ SOLN
INTRAMUSCULAR | Status: DC | PRN
  Administered 2022-11-04: 20 mL

## 2022-11-04 MED ORDER — SODIUM CHLORIDE 0.9 % IR SOLN
Status: DC | PRN
  Administered 2022-11-04 (×5): 3000 mL

## 2022-11-04 MED ORDER — POVIDONE-IODINE 10 % EX SWAB: 2.0000 | Freq: Once | CUTANEOUS | Status: DC

## 2022-11-04 MED ORDER — LIDOCAINE HCL (PF) 2 % IJ SOLN
INTRAMUSCULAR | Status: AC
  Filled 2022-11-04: qty 10

## 2022-11-04 MED ORDER — DEXMEDETOMIDINE HCL IN NACL 80 MCG/20ML IV SOLN
INTRAVENOUS | Status: DC | PRN
Start: 2022-11-04 — End: 2022-11-04
  Administered 2022-11-04: 12 ug via INTRAVENOUS

## 2022-11-04 MED ORDER — PROPOFOL 10 MG/ML IV BOLUS
INTRAVENOUS | Status: DC | PRN
  Administered 2022-11-04: 70 mg via INTRAVENOUS
  Administered 2022-11-04: 200 mg via INTRAVENOUS

## 2022-11-04 MED ORDER — EPHEDRINE SULFATE-NACL 50-0.9 MG/10ML-% IV SOSY
PREFILLED_SYRINGE | INTRAVENOUS | Status: DC | PRN
  Administered 2022-11-04 (×2): 5 mg via INTRAVENOUS

## 2022-11-04 MED ORDER — OXYCODONE HCL 5 MG/5ML PO SOLN: 5.0000 mg | Freq: Once | ORAL | Status: DC | PRN

## 2022-11-04 MED ORDER — LACTATED RINGERS IV SOLN: INTRAVENOUS | Status: DC

## 2022-11-04 MED ORDER — DEXAMETHASONE SODIUM PHOSPHATE 10 MG/ML IJ SOLN
INTRAMUSCULAR | Status: AC
  Filled 2022-11-04: qty 1

## 2022-11-04 MED ORDER — ACETAMINOPHEN 500 MG PO TABS
1000.0000 mg | ORAL_TABLET | Freq: Once | ORAL | Status: AC
  Administered 2022-11-04: 1000 mg via ORAL

## 2022-11-04 MED ORDER — ONDANSETRON HCL 4 MG/2ML IJ SOLN
INTRAMUSCULAR | Status: DC | PRN
  Administered 2022-11-04: 4 mg via INTRAVENOUS

## 2022-11-04 MED ORDER — MIDAZOLAM HCL 2 MG/2ML IJ SOLN
INTRAMUSCULAR | Status: AC
  Filled 2022-11-04: qty 2

## 2022-11-04 MED ORDER — LEVONORGESTREL 20 MCG/DAY IU IUD
1.0000 | INTRAUTERINE_SYSTEM | INTRAUTERINE | Status: AC
  Administered 2022-11-04: 1 via INTRAUTERINE

## 2022-11-04 MED ORDER — KETOROLAC TROMETHAMINE 30 MG/ML IJ SOLN: 30.0000 mg | Freq: Once | INTRAMUSCULAR | Status: DC | PRN

## 2022-11-04 MED ORDER — LEVONORGESTREL 20 MCG/DAY IU IUD
INTRAUTERINE_SYSTEM | INTRAUTERINE | Status: AC
  Filled 2022-11-04: qty 1

## 2022-11-04 MED ORDER — EPHEDRINE 5 MG/ML INJ
INTRAVENOUS | Status: AC
  Filled 2022-11-04: qty 5

## 2022-11-04 MED ORDER — OXYCODONE HCL 5 MG PO TABS
5.0000 mg | ORAL_TABLET | ORAL | 0 refills | Status: AC | PRN
Start: 2022-11-04 — End: ?

## 2022-11-04 MED ORDER — PHENYLEPHRINE 80 MCG/ML (10ML) SYRINGE FOR IV PUSH (FOR BLOOD PRESSURE SUPPORT)
PREFILLED_SYRINGE | INTRAVENOUS | Status: DC | PRN
  Administered 2022-11-04 (×5): 80 ug via INTRAVENOUS

## 2022-11-04 MED ORDER — FENTANYL CITRATE (PF) 250 MCG/5ML IJ SOLN
INTRAMUSCULAR | Status: DC | PRN
  Administered 2022-11-04 (×6): 25 ug via INTRAVENOUS

## 2022-11-04 SURGICAL SUPPLY — 19 items
CATH ROBINSON RED A/P 16FR (CATHETERS) ×1 IMPLANT
DEVICE MYOSURE LITE (MISCELLANEOUS) IMPLANT
DEVICE MYOSURE REACH (MISCELLANEOUS) IMPLANT
DILATOR CANAL MILEX (MISCELLANEOUS) IMPLANT
DRSG TELFA 3X8 NADH STRL (GAUZE/BANDAGES/DRESSINGS) IMPLANT
GAUZE 4X4 16PLY ~~LOC~~+RFID DBL (SPONGE) ×2 IMPLANT
GLOVE BIOGEL PI IND STRL 7.0 (GLOVE) ×2 IMPLANT
GLOVE SURG SS PI 6.5 STRL IVOR (GLOVE) ×1 IMPLANT
GOWN STRL REUS W/TWL LRG LVL3 (GOWN DISPOSABLE) ×2 IMPLANT
IV NS IRRIG 3000ML ARTHROMATIC (IV SOLUTION) IMPLANT
KIT PROCEDURE FLUENT (KITS) ×1 IMPLANT
KIT TURNOVER CYSTO (KITS) ×1 IMPLANT
MIrena Levonorgestrel-releasing Intrauterine syste IMPLANT
PACK VAGINAL MINOR WOMEN LF (CUSTOM PROCEDURE TRAY) ×1 IMPLANT
PAD OB MATERNITY 4.3X12.25 (PERSONAL CARE ITEMS) ×1 IMPLANT
SEAL CERVICAL OMNI LOK (ABLATOR) IMPLANT
SEAL ROD LENS SCOPE MYOSURE (ABLATOR) ×1 IMPLANT
SLEEVE SCD COMPRESS KNEE MED (STOCKING) ×1 IMPLANT
TOWEL OR 17X24 6PK STRL BLUE (TOWEL DISPOSABLE) ×1 IMPLANT

## 2022-11-04 NOTE — Op Note (Addendum)
Patient: Whitney Robbins DOB: August 30, 1990 MRN: [id number] Date of Procedure: 11/04/2022.   PREOP DIAGNOSIS:  1.Prolonged and heavy periods.  2.Endometrial thickening.  3. BMI of 49. 4. Stenotic cervix.       Post operative diagnosis: Same as above.  Procedures:  Dilation and [place] hysteroscopy with Myosure. Mirena IUD insertion.   Surgeon: Dr. Hoover Browns.  Assistant: None.   Anesthesia: General anesthesia- LMA.  Complications: None.  Fluid deficit:  1745 cc.  IV fluids: 800 cc LR.   Urine: voided before procedure.   EBL: 20 cc.  Indications: 32y/o P0 with a BMI of 49 with a history of heavy and prolonged periods, found to have thickened endometrium, who failed in office attempt of endometrial biopsy and Mirena IUD placement here for dilation and curettage hysteroscopy with myosure and Mirena IUD placement.   Procedure:  Informed consent was obtained from the patient to undergo the procedures.  She was taken to the operating room and anesthesia was administered without difficulty. An exam was then performed under anesthesia revealing a small anteverted uterus and a closed cervix. There were no palpable adnexal masses.  She was prepped and draped in the usual sterile fashion.  A large, long graves speculum was used to view the cervix. Single-tooth tenaculum was placed on the cervix. The os finder was used to find the cervical os.  The cervix was dilated to #15 pratt dilators and the hysteroscope was advanced through the internal cervical os. The uterine lining was noted to be thickened diffusely and with blood clots.  The  thickened lining was shaved off with the Myosure LITE under direct visualization ensuring to sample most of the endometrial lining.  A large amount of tissue was obtained from the shavings therefore sharp curettage was not performed.  The left tubal ostia was noted during the procedure but not the right one due to the thickened endometrial lining.  The Myosure and  hysteroscope was removed. The MIrena IUD was placed in as per protocol after uterus had been sounded to 9 cm. The IUD strings in the vagina were trimmed.  All instruments were then removed.  Hemostasis at the cervix was obtained with silver nitrate sticks.  The patient was awoken from anesthesia and taken to the recovery room in stable condition.  Specimen: Endometrial lesion shavings suspect endometrial hyperplasia.      Disposition: Stable to PACU.    Dr. Hoover Browns 11/04/2022.

## 2022-11-04 NOTE — Interval H&P Note (Signed)
History and Physical Interval Note:  11/04/2022 8:05 AM  Whitney Robbins  has presented today for surgery, with the diagnosis of Endometrium thickened, prolonged heavy periods.  The various methods of treatment have been discussed with the patient and family. After consideration of risks, benefits and other options for treatment, the patient has consented to  DILATATION & CURETTAGE/HYSTEROSCOPY WITH POSSIBLE MYOSURE (N/A) INTRAUTERINE DEVICE (IUD) INSERTION (N/A) - MIRENA as a surgical intervention.  The patient's history has been reviewed, patient examined, no change in status, stable for surgery.  I have reviewed the patient's chart and labs.  Questions were answered to the patient's satisfaction.   Prescilla Sours, MD.

## 2022-11-04 NOTE — Anesthesia Postprocedure Evaluation (Signed)
Anesthesia Post Note  Patient: Whitney Robbins  Procedure(s) Performed: DILATATION & CURETTAGE/HYSTEROSCOPY WITH MYOSURE (Uterus) INTRAUTERINE DEVICE (IUD) INSERTION (Uterus)     Patient location during evaluation: PACU Anesthesia Type: General Level of consciousness: awake and alert Pain management: pain level controlled Vital Signs Assessment: post-procedure vital signs reviewed and stable Respiratory status: spontaneous breathing, nonlabored ventilation, respiratory function stable and patient connected to nasal cannula oxygen Cardiovascular status: blood pressure returned to baseline and stable Postop Assessment: no apparent nausea or vomiting Anesthetic complications: no   No notable events documented.  Last Vitals:  Vitals:   11/04/22 1030 11/04/22 1055  BP: 138/81 137/79  Pulse: 92 93  Resp: 20 19  Temp:  36.4 C  SpO2: 91% 95%    Last Pain:  Vitals:   11/04/22 1055  TempSrc:   PainSc: 0-No pain                 Trevor Iha

## 2022-11-04 NOTE — Transfer of Care (Signed)
Immediate Anesthesia Transfer of Care Note  Patient: Whitney Robbins  Procedure(s) Performed: DILATATION & CURETTAGE/HYSTEROSCOPY WITH MYOSURE (Uterus) INTRAUTERINE DEVICE (IUD) INSERTION (Uterus)  Patient Location: PACU  Anesthesia Type:General  Level of Consciousness: drowsy and patient cooperative  Airway & Oxygen Therapy: Patient Spontanous Breathing and Patient connected to nasal cannula oxygen  Post-op Assessment: Report given to RN and Post -op Vital signs reviewed and stable  Post vital signs: Reviewed and stable  Last Vitals:  Vitals Value Taken Time  BP 135/88 11/04/22 0945  Temp 36.6 C 11/04/22 0945  Pulse 92 11/04/22 0949  Resp 21 11/04/22 0949  SpO2 90 % 11/04/22 0949  Vitals shown include unfiled device data.  Last Pain:  Vitals:   11/04/22 0616  TempSrc: Oral  PainSc: 0-No pain      Patients Stated Pain Goal: 5 (11/04/22 6063)  Complications: No notable events documented.

## 2022-11-04 NOTE — Anesthesia Procedure Notes (Signed)
Procedure Name: LMA Insertion Date/Time: 11/04/2022 8:13 AM  Performed by: Dairl Ponder, CRNAPre-anesthesia Checklist: Patient identified, Emergency Drugs available, Suction available and Patient being monitored Patient Re-evaluated:Patient Re-evaluated prior to induction Oxygen Delivery Method: Circle System Utilized Preoxygenation: Pre-oxygenation with 100% oxygen Induction Type: IV induction Ventilation: Mask ventilation without difficulty LMA: LMA inserted LMA Size: 4.0 Number of attempts: 1 Airway Equipment and Method: Bite block Placement Confirmation: positive ETCO2 Tube secured with: Tape Dental Injury: Teeth and Oropharynx as per pre-operative assessment

## 2022-11-04 NOTE — Discharge Instructions (Addendum)
  Whitney Robbins, 1. Nothing in vagina x 2 weeks i.e no intercourse, no tampons, no douching.    2. Expect some vaginal bleeding for next several days, call me if with excessive bleeding requiring you to change a pad every hour or two.  3. You may also see brown/silver/black residue on your pad, this is from agents used to control bleeding during the procedure, this residue with taper off with time so you do not have to worry about it.   4.  Expect some abdominal cramping, take tylenol extra strength as needed for pain and you may add ibuprofen and oxycodone that have been prescribed as needed.  Call me if pain is intolerable despite medication use. 5. Continue taking your norethindrone acetate as you have been doing twice a day for one more week then you may stop it.  6.  Take it easy rest of the day tomorrow but tomorrow you may resume light activity then regular activity later as tolerated.  7.  Call me with any questions.  Dr. Sallye Ober.  Office: [phone] Extension: [phone] If after hours please speak to provider on call in the prompts.            No acetaminophen/Tylenol until after 12:15 pm today if needed.  No ibuprofen, Advil, Aleve, Motrin, ketorolac, meloxicam, naproxen, or other NSAIDS until after 3:15 pm today if needed.   Post Anesthesia Home Care Instructions  Activity: Get plenty of rest for the remainder of the day. A responsible individual must stay with you for 24 hours following the procedure.  For the next 24 hours, DO NOT: -Drive a car -Advertising copywriter -Drink alcoholic beverages -Take any medication unless instructed by your physician -Make any legal decisions or sign important papers.  Meals: Start with liquid foods such as gelatin or soup. Progress to regular foods as tolerated. Avoid greasy, spicy, heavy foods. If nausea and/or vomiting occur, drink only clear liquids until the nausea and/or vomiting subsides. Call your physician if vomiting continues.  Special  Instructions/Symptoms: Your throat may feel dry or sore from the anesthesia or the breathing tube placed in your throat during surgery. If this causes discomfort, gargle with warm salt water. The discomfort should disappear within 24 hours.

## 2022-11-08 ENCOUNTER — Encounter (HOSPITAL_BASED_OUTPATIENT_CLINIC_OR_DEPARTMENT_OTHER): Payer: Self-pay | Admitting: Obstetrics & Gynecology

## 2022-11-10 LAB — SURGICAL PATHOLOGY

## 2022-12-28 IMAGING — US US PELVIS COMPLETE WITH TRANSVAGINAL
1 series · 13 of 25 positions shown · non-contrast
Comparison: None.
COMPARISON: None.

Addendum:
CLINICAL DATA: Abnormal uterine bleeding

EXAM:
TRANSABDOMINAL AND TRANSVAGINAL ULTRASOUND OF PELVIS
DOPPLER ULTRASOUND OF OVARIES
TECHNIQUE: Both transabdominal and transvaginal ultrasound examinations of the
pelvis were performed. Transabdominal technique was performed for
global imaging of the pelvis including uterus, ovaries, adnexal
regions, and pelvic cul-de-sac.
It was necessary to proceed with endovaginal exam following the
transabdominal exam to visualize the endometrium and ovaries. Color
and duplex Doppler ultrasound was utilized to evaluate blood flow to
the ovaries.

[Series 1: us pelvis complete with transvaginal · 0.30mm/px · 13 of 39 slices shown]
[im 1/39]
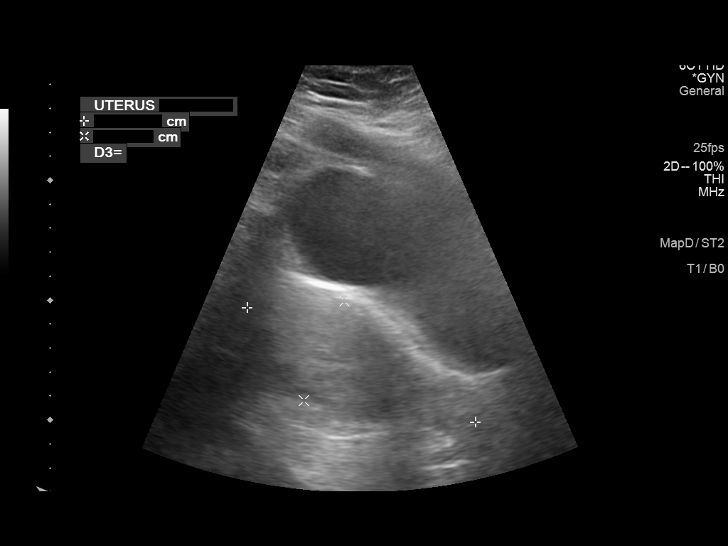
[im 4/39]
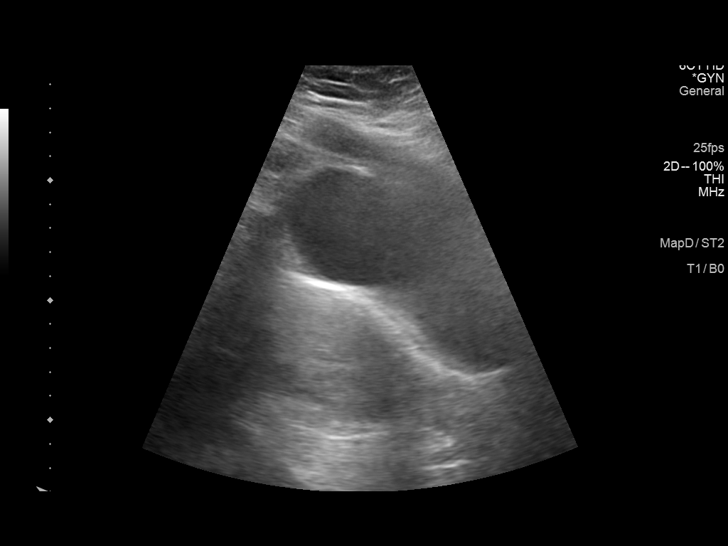
[im 7/39]
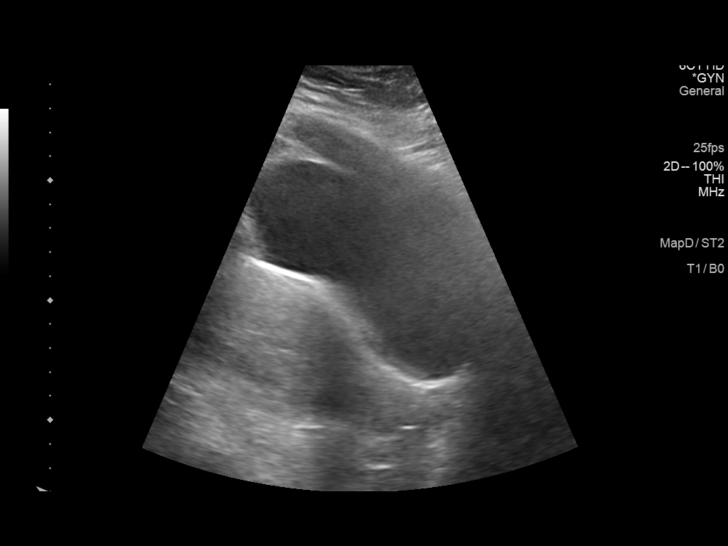
[im 10/39]
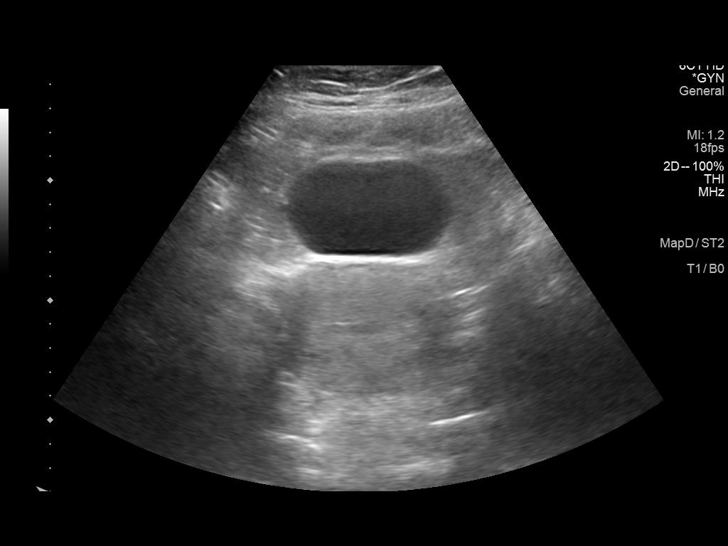
[im 13/39]
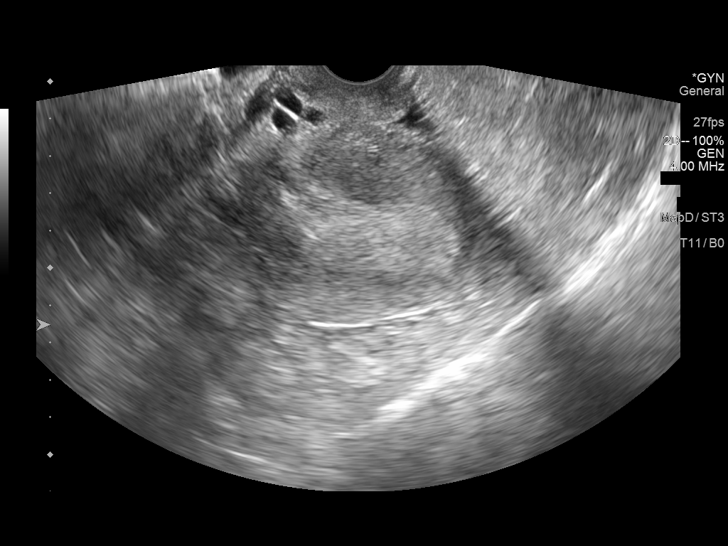
[im 16/39]
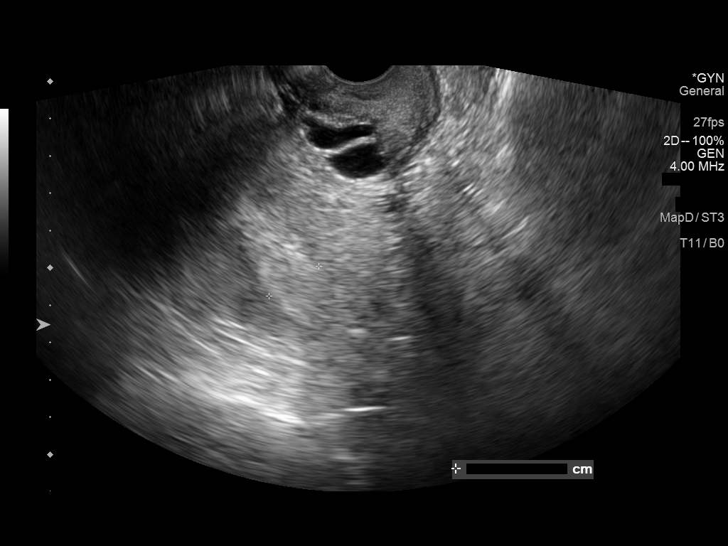
[im 20/39]
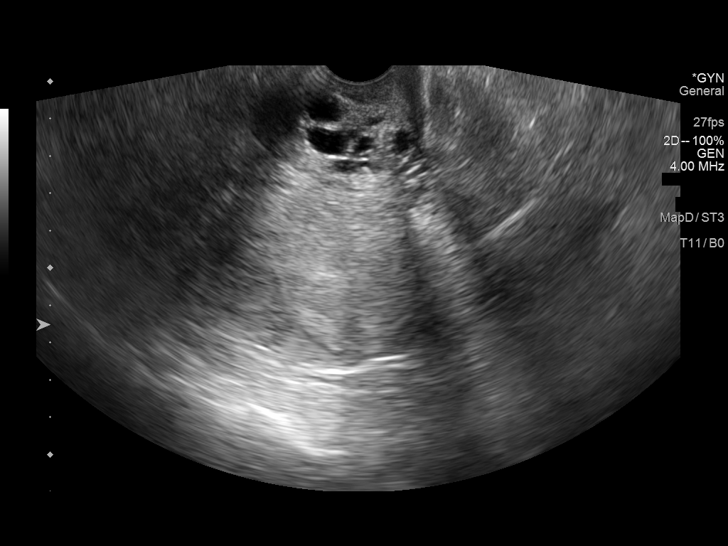
[im 23/39]
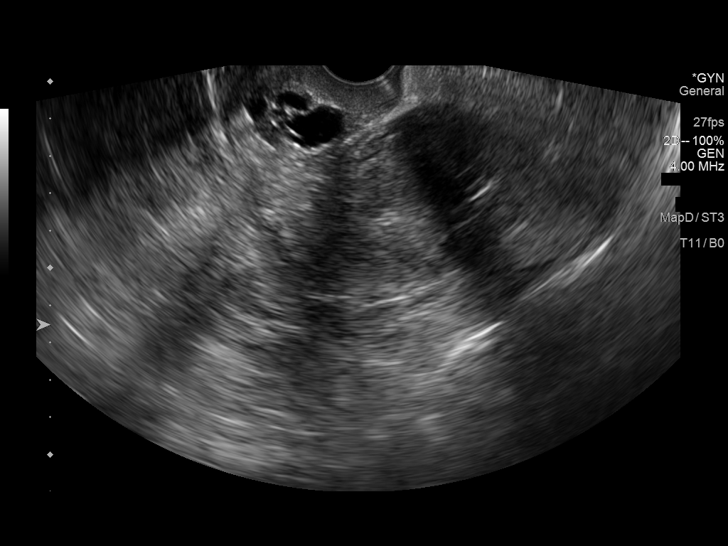
[im 26/39]
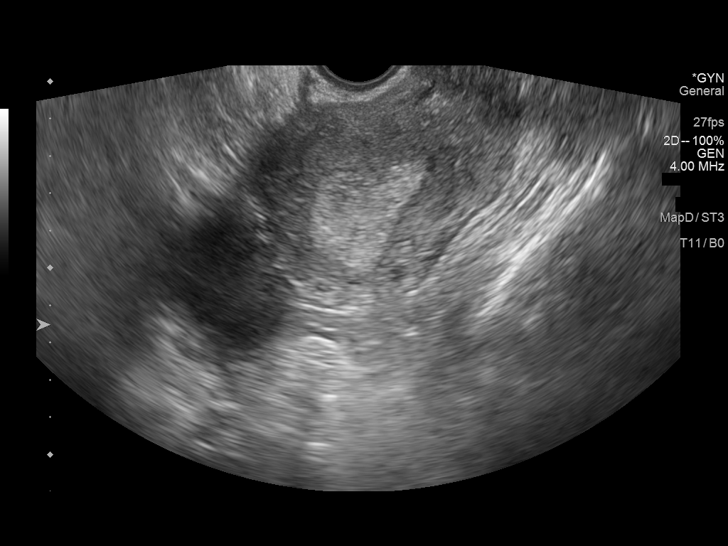
[im 29/39]
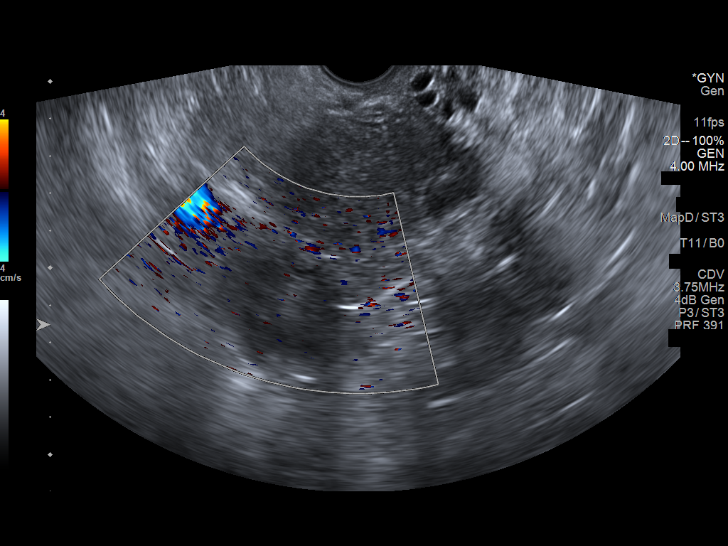
[im 32/39]
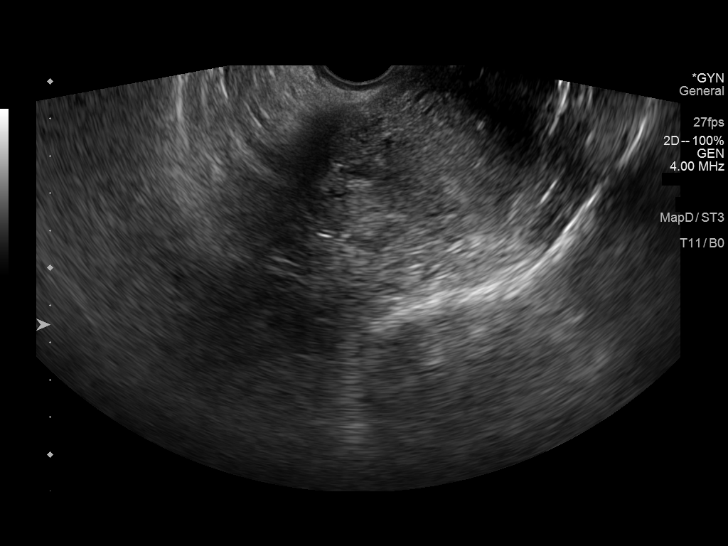
[im 35/39]
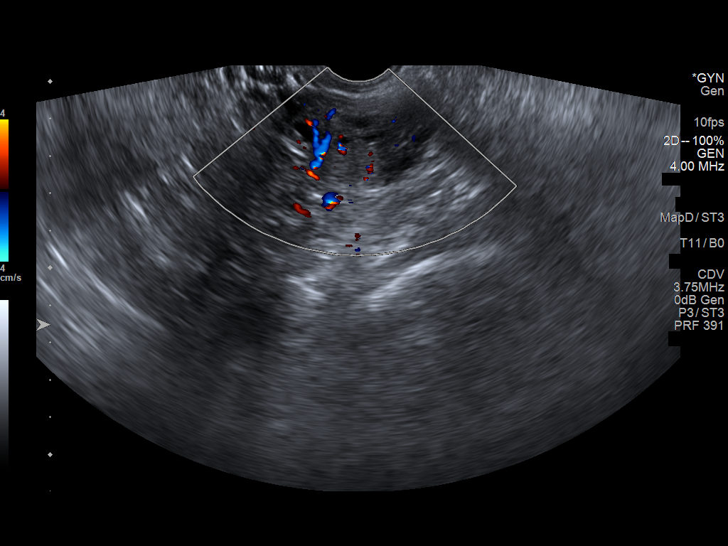
[im 39/39]
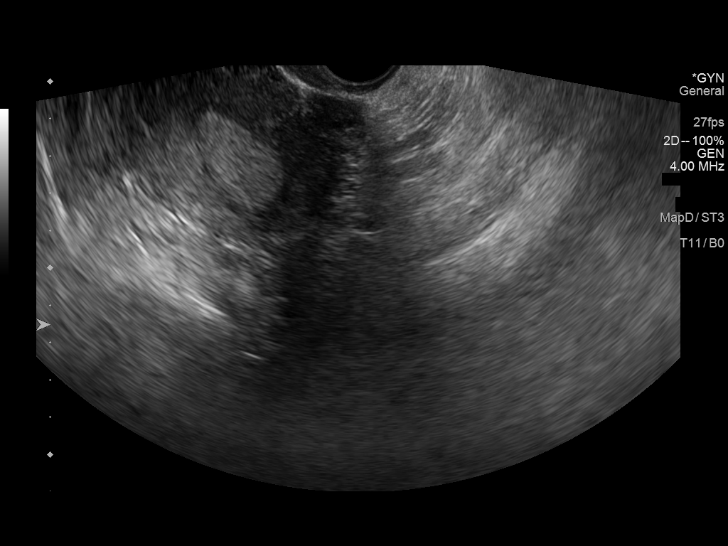

[13 of 25 positions shown; findings below may reference images not displayed]

FINDINGS: Uterus

Measurements: 10.7 x 5.5 x 7.1 cm = volume: 177.6 mL. No fibroids or
other mass visualized. In the transvaginal images uterus appears to
be retroverted.

Endometrium

Thickness: 15.5.  No focal abnormality visualized.

Right ovary

Measurements: 3.8 x 2.9 x 2.7 cm = volume: 15.3 mL. Normal
appearance/no adnexal mass.

Left ovary

Measurements: 4.8 x 2.4 x 4.3 cm = volume: 26 mL. Normal
appearance/no adnexal mass.

Pulsed Doppler evaluation of both ovaries demonstrates normal
low-resistance arterial and venous waveforms.

Other findings

No abnormal free fluid.
IMPRESSION: Uterus is retroverted. Endometrial stripe is prominent measuring
15.5 mm. This may suggest secretory phase of menstrual cycle or
endometrial hyperplasia. Follow-up pelvic sonogram in 1-2 months may
be considered.

There are no dominant adnexal masses. There is no free fluid in the
pelvis.

ADDENDUM:
The following should be changed in the technique and body of the
original report. Pulsed Doppler of the ovaries was not performed in
the current study. Color Doppler examination of both adnexal regions
was performed in the current study.

*** End of Addendum ***
FINDINGS: Uterus

Measurements: 10.7 x 5.5 x 7.1 cm = volume: 177.6 mL. No fibroids or
other mass visualized. In the transvaginal images uterus appears to
be retroverted.

Endometrium

Thickness: 15.5.  No focal abnormality visualized.

Right ovary

Measurements: 3.8 x 2.9 x 2.7 cm = volume: 15.3 mL. Normal
appearance/no adnexal mass.

Left ovary

Measurements: 4.8 x 2.4 x 4.3 cm = volume: 26 mL. Normal
appearance/no adnexal mass.

Pulsed Doppler evaluation of both ovaries demonstrates normal
low-resistance arterial and venous waveforms.

Other findings

No abnormal free fluid.
IMPRESSION: Uterus is retroverted. Endometrial stripe is prominent measuring
15.5 mm. This may suggest secretory phase of menstrual cycle or
endometrial hyperplasia. Follow-up pelvic sonogram in 1-2 months may
be considered.

There are no dominant adnexal masses. There is no free fluid in the
pelvis.
